# Patient Record
Sex: Female | Born: 1995 | Race: Black or African American | Hispanic: No | Smoking: Current some day smoker
Health system: Southern US, Community
[De-identification: ages and names within clinical notes are randomized; demographics above are authoritative.]

## PROBLEM LIST (undated history)

## (undated) ENCOUNTER — Inpatient Hospital Stay: Payer: Self-pay

## (undated) ENCOUNTER — Emergency Department: Admission: EM | Payer: BC Managed Care – PPO | Source: Home / Self Care

## (undated) ENCOUNTER — Emergency Department: Admission: EM | Discharge status: Against Medical Advice | Payer: BC Managed Care – PPO | Source: Home / Self Care

## (undated) DIAGNOSIS — J302 Other seasonal allergic rhinitis: Secondary | ICD-10-CM

## (undated) DIAGNOSIS — J45909 Unspecified asthma, uncomplicated: Secondary | ICD-10-CM

## (undated) HISTORY — PX: KNEE SURGERY: SHX244

## (undated) HISTORY — PX: NO PAST SURGERIES: SHX2092

## (undated) HISTORY — PX: TUBAL LIGATION: SHX77

---
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
## 2015-08-10 LAB — OB RESULTS CONSOLE HEPATITIS B SURFACE ANTIGEN: Hepatitis B Surface Ag: NEGATIVE

## 2015-08-10 LAB — OB RESULTS CONSOLE HIV ANTIBODY (ROUTINE TESTING): HIV: NONREACTIVE

## 2015-08-10 LAB — OB RESULTS CONSOLE RPR: RPR: NONREACTIVE

## 2015-08-10 LAB — OB RESULTS CONSOLE RUBELLA ANTIBODY, IGM: Rubella: IMMUNE

## 2015-09-05 ENCOUNTER — Ambulatory Visit
Admission: EM | Admit: 2015-09-05 | Discharge: 2015-09-05 | Disposition: A | Discharge status: Home / Self Care | Payer: BLUE CROSS/BLUE SHIELD | Attending: Family Medicine | Admitting: Family Medicine

## 2015-09-05 DIAGNOSIS — J0100 Acute maxillary sinusitis, unspecified: Secondary | ICD-10-CM | POA: Diagnosis not present

## 2015-09-05 MED ORDER — AMOXICILLIN 875 MG PO TABS: 875.0000 mg | ORAL_TABLET | Freq: Two times a day (BID) | ORAL | 0 refills | Status: DC

## 2015-09-05 NOTE — ED Triage Notes (Signed)
Patient complains of sinus pain and pressure x 1 week. Patient states that she has been also having a cough. Patient states that she is [redacted] weeks pregnant.

## 2015-09-05 NOTE — ED Provider Notes (Signed)
[facility]    CSN: [id number] Arrival date & time: 09/05/15  4098  First Provider Contact:  First MD Initiated Contact with Patient 09/05/15 [phone]        History   Chief Complaint Chief Complaint  Patient presents with  . Sinusitis    HPI Sheri Lee is a 20 y.o. female.   The history is provided by the patient.  URI  Presenting symptoms: congestion, ear pain, facial pain and fatigue   Severity:  Moderate Onset quality:  Sudden Duration:  1 week Timing:  Constant Progression:  Worsening Chronicity:  New Relieved by:  None tried Associated symptoms: headaches and sinus pain   Associated symptoms: no swollen glands and no wheezing   Risk factors: sick contacts   Risk factors: not elderly, no chronic cardiac disease, no chronic kidney disease, no chronic respiratory disease, no diabetes mellitus, no immunosuppression, no recent illness and no recent travel   Patient complains of sinus pain and pressure x 1 week. Patient states that she has been also having a cough. Patient states that she is [redacted] weeks pregnant.   Past Medical History:  Diagnosis Date  . Asthma   . Seasonal allergies     There are no active problems to display for this patient.   Past Surgical History:  Procedure Laterality Date  . KNEE SURGERY Left   . NO PAST SURGERIES      OB History    Gravida Para Term Preterm AB Living   1             SAB TAB Ectopic Multiple Live Births                   Home Medications    Prior to Admission medications   Medication Sig Start Date End Date Taking? Authorizing Provider  Prenatal Vit-Fe Fumarate-FA (MULTIVITAMIN-PRENATAL) 27-0.8 MG TABS tablet Take 1 tablet by mouth daily at 12 noon.   Yes Historical Provider, MD  amoxicillin (AMOXIL) 875 MG tablet Take 1 tablet (875 mg total) by mouth 2 (two) times daily. 09/05/15   Payton Mccallum, MD  diazepam (VALIUM) 2 MG tablet Take 1 tablet (2 mg total) by mouth 3 (three) times daily. 10/07/14    Lutricia Feil, PA-C  HYDROcodone-acetaminophen (NORCO) 5-325 MG tablet Take 1 tablet by mouth every 6 (six) hours as needed for moderate pain. 10/06/14   Irean Hong, MD  meloxicam (MOBIC) 15 MG tablet Take 1 tablet (15 mg total) by mouth daily. Do not take Naprosyn or Motrin. 10/14/14   Hassan Rowan, MD  metaxalone (SKELAXIN) 800 MG tablet Take 1 tablet (800 mg total) by mouth 3 (three) times daily. Begin taking only after finishing Valium for muscle spasm 10/07/14   Lutricia Feil, PA-C  metaxalone (SKELAXIN) 800 MG tablet Take 1 tablet (800 mg total) by mouth 3 (three) times daily. Do not take with Valium and may use this with previous Skelaxin prescription 10/14/14   Hassan Rowan, MD    Family History History reviewed. No pertinent family history.  Social History Social History  Substance Use Topics  . Smoking status: Never Smoker  . Smokeless tobacco: Never Used  . Alcohol use No     Allergies   Albuterol   Review of Systems Review of Systems  Constitutional: Positive for fatigue.  HENT: Positive for congestion and ear pain.   Respiratory: Negative for wheezing.   Neurological: Positive for headaches.     Physical Exam Triage Vital  Signs ED Triage Vitals  Enc Vitals Group     BP 09/05/15 0832 108/64     Pulse Rate 09/05/15 0832 85     Resp 09/05/15 0832 18     Temp 09/05/15 0832 98.1 F (36.7 C)     Temp Source 09/05/15 0832 Oral     SpO2 09/05/15 0832 100 %     Weight 09/05/15 0833 120 lb (54.4 kg)     Height 09/05/15 0833 5\' 3"  (1.6 m)     Head Circumference --      Peak Flow --      Pain Score 09/05/15 0835 10     Pain Loc --      Pain Edu? --      Excl. in GC? --    No data found.   Updated Vital Signs BP 108/64 (BP Location: Left Arm)   Pulse 85   Temp 98.1 F (36.7 C) (Oral)   Resp 18   Ht 5\' 3"  (1.6 m)   Wt 120 lb (54.4 kg)   LMP 06/17/2015 (Exact Date)   SpO2 100%   BMI 21.26 kg/m   Visual Acuity Right Eye Distance:   Left Eye  Distance:   Bilateral Distance:    Right Eye Near:   Left Eye Near:    Bilateral Near:     Physical Exam  Constitutional: She appears well-developed and well-nourished. No distress.  HENT:  Head: Normocephalic and atraumatic.  Right Ear: Tympanic membrane, external ear and ear canal normal.  Left Ear: Tympanic membrane, external ear and ear canal normal.  Nose: Mucosal edema and rhinorrhea present. No nose lacerations, sinus tenderness, nasal deformity, septal deviation or nasal septal hematoma. No epistaxis.  No foreign bodies. Right sinus exhibits maxillary sinus tenderness and frontal sinus tenderness. Left sinus exhibits maxillary sinus tenderness and frontal sinus tenderness.  Mouth/Throat: Uvula is midline, oropharynx is clear and moist and mucous membranes are normal. No oropharyngeal exudate.  Eyes: Conjunctivae and EOM are normal. Pupils are equal, round, and reactive to light. Right eye exhibits no discharge. Left eye exhibits no discharge. No scleral icterus.  Neck: Normal range of motion. Neck supple. No thyromegaly present.  Cardiovascular: Normal rate, regular rhythm and normal heart sounds.   Pulmonary/Chest: Effort normal and breath sounds normal. No respiratory distress. She has no wheezes. She has no rales.  Lymphadenopathy:    She has no cervical adenopathy.  Skin: She is not diaphoretic.  Nursing note and vitals reviewed.    UC Treatments / Results  Labs (all labs ordered are listed, but only abnormal results are displayed) Labs Reviewed - No data to display  EKG  EKG Interpretation None       Radiology No results found.  Procedures Procedures (including critical care time)  Medications Ordered in UC Medications - No data to display   Initial Impression / Assessment and Plan / UC Course  I have reviewed the triage vital signs and the nursing notes.  Pertinent labs & imaging results that were available during my care of the patient were reviewed  by me and considered in my medical decision making (see chart for details).  Clinical Course      Final Clinical Impressions(s) / UC Diagnoses   Final diagnoses:  Acute maxillary sinusitis, recurrence not specified    New Prescriptions Discharge Medication List as of 09/05/2015  8:58 AM    START taking these medications   Details  amoxicillin (AMOXIL) 875 MG tablet Take 1 tablet (  875 mg total) by mouth 2 (two) times daily., Starting Mon 09/05/2015, Normal        1. diagnosis reviewed with patient/parent/guardian/family 2. rx as per orders above; reviewed possible side effects, interactions, risks and benefits  3. Recommend supportive treatment with saline rinses    4. Follow-up prn if symptoms worsen or don't improve   Payton Mccallumrlando Mikia Delaluz, MD 09/05/15 [id number]

## 2015-09-26 LAB — OB RESULTS CONSOLE GC/CHLAMYDIA: Gonorrhea: NEGATIVE

## 2015-10-03 ENCOUNTER — Ambulatory Visit
Admission: EM | Admit: 2015-10-03 | Discharge: 2015-10-03 | Disposition: A | Discharge status: Home / Self Care | Payer: BLUE CROSS/BLUE SHIELD | Attending: Family Medicine | Admitting: Family Medicine

## 2015-10-03 DIAGNOSIS — Z202 Contact with and (suspected) exposure to infections with a predominantly sexual mode of transmission: Secondary | ICD-10-CM

## 2015-10-03 LAB — URINALYSIS COMPLETE WITH MICROSCOPIC (ARMC ONLY)
Bacteria, UA: NONE SEEN
Bilirubin Urine: NEGATIVE
Glucose, UA: NEGATIVE mg/dL
Hgb urine dipstick: NEGATIVE
Ketones, ur: NEGATIVE mg/dL
Leukocytes, UA: NEGATIVE
Nitrite: NEGATIVE
Protein, ur: NEGATIVE mg/dL
RBC / HPF: NONE SEEN RBC/hpf (ref 0–5)
Specific Gravity, Urine: 1.025 (ref 1.005–1.030)
pH: 6 (ref 5.0–8.0)

## 2015-10-03 LAB — WET PREP, GENITAL
Clue Cells Wet Prep HPF POC: NONE SEEN
Sperm: NONE SEEN
Trich, Wet Prep: NONE SEEN
Yeast Wet Prep HPF POC: NONE SEEN

## 2015-10-03 MED ORDER — CEFTRIAXONE SODIUM 250 MG IJ SOLR
500.0000 mg | Freq: Once | INTRAMUSCULAR | Status: AC
  Administered 2015-10-03: 500 mg via INTRAMUSCULAR

## 2015-10-03 MED ORDER — AZITHROMYCIN 500 MG PO TABS
1000.0000 mg | ORAL_TABLET | Freq: Once | ORAL | Status: AC
  Administered 2015-10-03: 1000 mg via ORAL

## 2015-10-03 NOTE — ED Provider Notes (Signed)
[facility]    CSN: [id number] Arrival date & time: 10/03/15  1618  First Provider Contact:  First MD Initiated Contact with Patient 10/03/15 1740        History   Chief Complaint Chief Complaint  Patient presents with  . SEXUALLY TRANSMITTED DISEASE    HPI Sheri Lee is a 20 y.o. female.   She is [redacted] weeks pregnant about interest second trimester as of tomorrow. She reports that her partner was tested positive for chlamydia today. She was testing when she had her first OB/GYN visit which was about 10 weeks ago at that time she was negative. Apparently about 2 months ago partner became active somebody else. She also reports in the last 2 weeks he is on perform oral sex but does admit that about 6 weeks so they did have sexual relations involving penile vaginal activity she denies having a discharge. History of asthma seasonal asthma. She's had knee surgery but no other surgeries she is a gravida 1 para 0 AB 0. She never smoked. Is no past family medical history with today's visit.   The history is provided by the patient and a significant other. No language interpreter was used.  Exposure to STD  This is a new problem. The current episode started more than 1 week ago. The problem has been rapidly worsening. Pertinent negatives include no chest pain and no abdominal pain. Nothing aggravates the symptoms. Nothing relieves the symptoms. She has tried nothing for the symptoms. The treatment provided no relief.    Past Medical History:  Diagnosis Date  . Asthma   . Seasonal allergies     There are no active problems to display for this patient.   Past Surgical History:  Procedure Laterality Date  . KNEE SURGERY Left   . NO PAST SURGERIES      OB History    Gravida Para Term Preterm AB Living   1             SAB TAB Ectopic Multiple Live Births                   Home Medications    Prior to Admission medications   Medication Sig Start Date End Date  Taking? Authorizing Provider  Prenatal Vit-Fe Fumarate-FA (MULTIVITAMIN-PRENATAL) 27-0.8 MG TABS tablet Take 1 tablet by mouth daily at 12 noon.   Yes Historical Provider, MD  amoxicillin (AMOXIL) 875 MG tablet Take 1 tablet (875 mg total) by mouth 2 (two) times daily. 09/05/15   Payton Mccallumrlando Conty, MD  diazepam (VALIUM) 2 MG tablet Take 1 tablet (2 mg total) by mouth 3 (three) times daily. 10/07/14   Lutricia FeilWilliam P Roemer, PA-C  HYDROcodone-acetaminophen (NORCO) 5-325 MG tablet Take 1 tablet by mouth every 6 (six) hours as needed for moderate pain. 10/06/14   Irean HongJade J Sung, MD  meloxicam (MOBIC) 15 MG tablet Take 1 tablet (15 mg total) by mouth daily. Do not take Naprosyn or Motrin. 10/14/14   Hassan RowanEugene Melessa Cowell, MD  metaxalone (SKELAXIN) 800 MG tablet Take 1 tablet (800 mg total) by mouth 3 (three) times daily. Begin taking only after finishing Valium for muscle spasm 10/07/14   Lutricia FeilWilliam P Roemer, PA-C  metaxalone (SKELAXIN) 800 MG tablet Take 1 tablet (800 mg total) by mouth 3 (three) times daily. Do not take with Valium and may use this with previous Skelaxin prescription 10/14/14   Hassan RowanEugene Siddiq Kaluzny, MD    Family History History reviewed. No pertinent family history.  Social  History Social History  Substance Use Topics  . Smoking status: Never Smoker  . Smokeless tobacco: Never Used  . Alcohol use No     Allergies   Albuterol   Review of Systems Review of Systems  Cardiovascular: Negative for chest pain.  Gastrointestinal: Negative for abdominal pain.  All other systems reviewed and are negative.    Physical Exam Triage Vital Signs ED Triage Vitals  Enc Vitals Group     BP 10/03/15 1634 115/64     Pulse Rate 10/03/15 1634 89     Resp 10/03/15 1634 18     Temp 10/03/15 1634 98 F (36.7 C)     Temp Source 10/03/15 1634 Oral     SpO2 10/03/15 1634 100 %     Weight 10/03/15 1632 120 lb (54.4 kg)     Height 10/03/15 1632 5\' 3"  (1.6 m)     Head Circumference --      Peak Flow --      Pain Score  10/03/15 1633 0     Pain Loc --      Pain Edu? --      Excl. in GC? --    No data found.   Updated Vital Signs BP 115/64 (BP Location: Left Arm)   Pulse 89   Temp 98 F (36.7 C) (Oral)   Resp 18   Ht 5\' 3"  (1.6 m)   Wt 120 lb (54.4 kg)   LMP 06/17/2015 (Exact Date)   SpO2 100%   BMI 21.26 kg/m   Visual Acuity Right Eye Distance:   Left Eye Distance:   Bilateral Distance:    Right Eye Near:   Left Eye Near:    Bilateral Near:     Physical Exam  Constitutional: She appears well-developed and well-nourished.  HENT:  Head: Normocephalic and atraumatic.  Eyes: Pupils are equal, round, and reactive to light.  Neck: Normal range of motion.  Pulmonary/Chest: Effort normal.  Abdominal: Soft.  Genitourinary: Rectum normal. Rectal exam shows no fissure and no mass. There is no rash on the right labia. There is no rash on the left labia. Uterus is enlarged. Cervix exhibits discharge. Cervix exhibits no motion tenderness and no friability. Right adnexum displays no mass and no tenderness. Left adnexum displays no mass. No erythema or tenderness in the vagina. No foreign body in the vagina. No signs of injury around the vagina. Vaginal discharge found.  Genitourinary Comments: Discharge was present patient is about this state OB age with fundal height about 14-16 weeks.  Musculoskeletal: Normal range of motion.  Neurological: She is alert.  Skin: Skin is warm and dry. Capillary refill takes less than 2 seconds.  Psychiatric: She has a normal mood and affect.  Vitals reviewed.    UC Treatments / Results  Labs (all labs ordered are listed, but only abnormal results are displayed) Labs Reviewed  WET PREP, GENITAL - Abnormal; Notable for the following:       Result Value   WBC, Wet Prep HPF POC MANY (*)    All other components within normal limits  CHLAMYDIA/NGC RT PCR ([facility] ONLY)  URINALYSIS COMPLETEWITH MICROSCOPIC ([facility] ONLY)    EKG  EKG Interpretation None        Radiology No results found.  Procedures Procedures (including critical care time)  Medications Ordered in UC Medications  cefTRIAXone (ROCEPHIN) injection 500 mg (500 mg Intramuscular Given 10/03/15 1809)  azithromycin (ZITHROMAX) tablet 1,000 mg (1,000 mg Oral Given 10/03/15 1808)  Initial Impression / Assessment and Plan / UC Course  I have reviewed the triage vital signs and the nursing notes.  Pertinent labs & imaging results that were available during my care of the patient were reviewed by me and considered in my medical decision making (see chart for details).    Results for orders placed or performed during the hospital encounter of 10/03/15  Wet prep, genital  Result Value Ref Range   Yeast Wet Prep HPF POC NONE SEEN NONE SEEN   Trich, Wet Prep NONE SEEN NONE SEEN   Clue Cells Wet Prep HPF POC NONE SEEN NONE SEEN   WBC, Wet Prep HPF POC MANY (A) NONE SEEN   Sperm NONE SEEN    Clinical Course   Patient will be given  500 mg of Rocephin IM and 1 g of Zithromax by mouth. Since she's almost the second trimester will give her Flagyl to start using tomorrow. Chlamydia and GC tests are pending  Final Clinical Impressions(s) / UC Diagnoses   Final diagnoses:  Exposure to sexually transmitted disease (STD)    New Prescriptions New Prescriptions   No medications on file     Hassan Rowan, MD 10/03/15 1610

## 2015-10-03 NOTE — ED Triage Notes (Signed)
Patient presents with a request for antibiotic to treat Chlamydia. She states she had oral sex with her partner and they were tested today and tested positive.

## 2015-10-04 LAB — CHLAMYDIA/NGC RT PCR (ARMC ONLY)
Chlamydia Tr: NOT DETECTED
N gonorrhoeae: NOT DETECTED

## 2015-12-02 ENCOUNTER — Observation Stay
Admission: EM | Admit: 2015-12-02 | Discharge: 2015-12-02 | Disposition: A | Discharge status: Home / Self Care | Payer: BLUE CROSS/BLUE SHIELD | Source: Intra-hospital | Attending: Obstetrics & Gynecology | Admitting: Obstetrics & Gynecology

## 2015-12-02 ENCOUNTER — Encounter: Payer: Self-pay | Admitting: *Deleted

## 2015-12-02 DIAGNOSIS — K5900 Constipation, unspecified: Secondary | ICD-10-CM | POA: Diagnosis not present

## 2015-12-02 DIAGNOSIS — O2692 Pregnancy related conditions, unspecified, second trimester: Secondary | ICD-10-CM | POA: Diagnosis present

## 2015-12-02 DIAGNOSIS — Z888 Allergy status to other drugs, medicaments and biological substances status: Secondary | ICD-10-CM | POA: Diagnosis not present

## 2015-12-02 DIAGNOSIS — R1013 Epigastric pain: Secondary | ICD-10-CM | POA: Insufficient documentation

## 2015-12-02 DIAGNOSIS — Z3A24 24 weeks gestation of pregnancy: Secondary | ICD-10-CM | POA: Diagnosis not present

## 2015-12-02 DIAGNOSIS — R109 Unspecified abdominal pain: Secondary | ICD-10-CM | POA: Diagnosis present

## 2015-12-02 DIAGNOSIS — J45909 Unspecified asthma, uncomplicated: Secondary | ICD-10-CM | POA: Diagnosis not present

## 2015-12-02 DIAGNOSIS — Z79899 Other long term (current) drug therapy: Secondary | ICD-10-CM | POA: Diagnosis not present

## 2015-12-02 LAB — FETAL FIBRONECTIN: FETAL FIBRONECTIN: NEGATIVE

## 2015-12-02 LAB — URINALYSIS COMPLETE WITH MICROSCOPIC (ARMC ONLY)
BILIRUBIN URINE: NEGATIVE
Bacteria, UA: NONE SEEN
GLUCOSE, UA: NEGATIVE mg/dL
HGB URINE DIPSTICK: NEGATIVE
Ketones, ur: NEGATIVE mg/dL
NITRITE: NEGATIVE
Protein, ur: NEGATIVE mg/dL
RBC / HPF: NONE SEEN RBC/hpf (ref 0–5)
SPECIFIC GRAVITY, URINE: 1.012 (ref 1.005–1.030)
pH: 8 (ref 5.0–8.0)

## 2015-12-02 LAB — CHLAMYDIA/NGC RT PCR (ARMC ONLY)
CHLAMYDIA TR: NOT DETECTED
N gonorrhoeae: NOT DETECTED

## 2015-12-02 LAB — WET PREP, GENITAL
Clue Cells Wet Prep HPF POC: NONE SEEN
Sperm: NONE SEEN
Trich, Wet Prep: NONE SEEN
Yeast Wet Prep HPF POC: NONE SEEN

## 2015-12-02 NOTE — OB Triage Note (Signed)
Pt states she has her next appointment with Dr Feliberto GottronSchermerhorn on Dec 7th/17. Instructed to call sooner if further concerns or questions arise- pt voiced agreement

## 2015-12-02 NOTE — Discharge Summary (Signed)
Sheri Lee is a 20 y.o. female. She is at 241w0d gestation.  Chief Complaint: upper abdominal pain   S: Resting comfortably. no CTX, no VB.no LOF,  Active fetal movement.   Location: epigastrium Context: patient started having intense upper abdominal pain at epigastrium and radiating to left side and downward this morning and it got worse.  Has improved some since arrival to triage. Onset/timing/duration: 09:30 AM today Quality: cramping, intermittent Severity: moderate to severe Aggravating factors: mothing Alleviating factors:nothing Associated signs/symptoms: denies nausea, vomiting, diarrhea.  Has been constipated.  No fever/chills.  Not related to food intake.  Did not overdo it yestrday @ thanksgiving dinner   Maternal Medical History:   Past Medical History:  Diagnosis Date  . Asthma   . Seasonal allergies     Past Surgical History:  Procedure Laterality Date  . KNEE SURGERY Left   . NO PAST SURGERIES      Allergies  Allergen Reactions  . Albuterol Swelling    Prior to Admission medications   Medication Sig Start Date End Date Taking? Authorizing Provider  Prenatal Vit-Fe Fumarate-FA (MULTIVITAMIN-PRENATAL) 27-0.8 MG TABS tablet Take 1 tablet by mouth daily at 12 noon.   Yes Historical Provider, MD  amoxicillin (AMOXIL) 875 MG tablet Take 1 tablet (875 mg total) by mouth 2 (two) times daily. Patient not taking: Reported on 12/02/2015 09/05/15   Payton Mccallumrlando Conty, MD  diazepam (VALIUM) 2 MG tablet Take 1 tablet (2 mg total) by mouth 3 (three) times daily. Patient not taking: Reported on 12/02/2015 10/07/14   Lutricia FeilWilliam P Roemer, PA-C  HYDROcodone-acetaminophen (NORCO) 5-325 MG tablet Take 1 tablet by mouth every 6 (six) hours as needed for moderate pain. Patient not taking: Reported on 12/02/2015 10/06/14   Irean HongJade J Sung, MD  meloxicam (MOBIC) 15 MG tablet Take 1 tablet (15 mg total) by mouth daily. Do not take Naprosyn or Motrin. Patient not taking: Reported on  12/02/2015 10/14/14   Hassan RowanEugene Wade, MD  metaxalone (SKELAXIN) 800 MG tablet Take 1 tablet (800 mg total) by mouth 3 (three) times daily. Begin taking only after finishing Valium for muscle spasm 10/07/14   Lutricia FeilWilliam P Roemer, PA-C  metaxalone (SKELAXIN) 800 MG tablet Take 1 tablet (800 mg total) by mouth 3 (three) times daily. Do not take with Valium and may use this with previous Skelaxin prescription Patient not taking: Reported on 12/02/2015 10/14/14   Hassan RowanEugene Wade, MD     [facility]   Social History: She  reports that she has never smoked. She has never used smokeless tobacco. She reports that she does not drink alcohol or use drugs.  Family History: family history is not on file.   Review of Systems: A full review of systems was performed and negative except as noted in the HPI.     O:  BP 115/68 (BP Location: Left Arm)   Pulse 84   Temp 97.9 F (36.6 C) (Oral)   Resp 16   Ht 5\' 3"  (1.6 m)   Wt 65.8 kg (145 lb)   LMP 06/17/2015 (Exact Date)   BMI 25.69 kg/m  Results for orders placed or performed during the hospital encounter of 12/02/15 (from the past 48 hour(s))  Chlamydia/NGC rt PCR ([facility] only)   Collection Time: 12/02/15  2:20 PM  Result Value Ref Range   Specimen source GC/Chlam ENDOCERVICAL    Chlamydia Tr NOT DETECTED NOT DETECTED   N gonorrhoeae NOT DETECTED NOT DETECTED  Wet prep, genital  Collection Time: 12/02/15  2:20 PM  Result Value Ref Range   Yeast Wet Prep HPF POC NONE SEEN NONE SEEN   Trich, Wet Prep NONE SEEN NONE SEEN   Clue Cells Wet Prep HPF POC NONE SEEN NONE SEEN   WBC, Wet Prep HPF POC RARE (A) NONE SEEN   Sperm NONE SEEN   Fetal fibronectin   Collection Time: 12/02/15  2:20 PM  Result Value Ref Range   Fetal Fibronectin NEGATIVE NEGATIVE   Appearance, FETFIB CLEAR CLEAR  Urinalysis complete, with microscopic ([facility] only)   Collection Time: 12/02/15  2:29 PM  Result Value Ref Range   Color, Urine STRAW (A) YELLOW    APPearance CLEAR (A) CLEAR   Glucose, UA NEGATIVE NEGATIVE mg/dL   Bilirubin Urine NEGATIVE NEGATIVE   Ketones, ur NEGATIVE NEGATIVE mg/dL   Specific Gravity, Urine 1.012 1.005 - 1.030   Hgb urine dipstick NEGATIVE NEGATIVE   pH 8.0 5.0 - 8.0   Protein, ur NEGATIVE NEGATIVE mg/dL   Nitrite NEGATIVE NEGATIVE   Leukocytes, UA 1+ (A) NEGATIVE   RBC / HPF NONE SEEN 0 - 5 RBC/hpf   WBC, UA 0-5 0 - 5 WBC/hpf   Bacteria, UA NONE SEEN NONE SEEN   Squamous Epithelial / LPF 0-5 (A) NONE SEEN   Mucous PRESENT      Constitutional: NAD, AAOx3  HE/ENT: extraocular movements grossly intact, moist mucous membranes CV: RRR PULM: nl respiratory effort, CTABL     Abd: gravid, non-tender, non-distended, soft      Ext: Non-tender, Nonedmeatous   Psych: mood appropriate, speech normal Pelvic:  Sterile speculum exam: no dilation SVE: c/l/h  FHT: 145 TOCO: irritable    A/P: 20yo G1P0 @ 24.0 weeks with GI upset   Labor: not present.   Fetal Wellbeing:  Cat 1 tracing.  Abdominal pain likely GI in origin, labs negative, non-reproducible pain on exam.  Patient comfortable in entirety of stay in triage.  For constipation, prunes, stool softener, plenty of hydration  FFN negative  D/c home stable, precautions reviewed, follow-up as scheduled.   ----- Ranae Plumberhelsea Avery Klingbeil, MD Attending Obstetrician and Gynecologist [facility], Department of OB/GYN [facility]

## 2015-12-02 NOTE — Discharge Instructions (Signed)
For constipation attention:  Increase fluids,  Increase raw fruit and vegetable input, Try whole grain food choices versus white grain products Try prune juice or prunes if desired Try fruit cups, and make sure to drink the juice Oregano steeped tea, may add honey or sugar to improve taste after removing the oregano Gas -Ex over the counter medication

## 2015-12-02 NOTE — OB Triage Note (Signed)
Pt discussed D/C instructions. Also stated she no linger takes the following medications: amoxicillin- finished course for sinus infection, Diazepam, Norco, Meloxicam,

## 2015-12-02 NOTE — OB Triage Note (Signed)
Pt here with compliants of abdominal pain that at 0930 this a.m., pain getting worse - pt. Decided to come to hospital to be evaluated.  Denies sudden gush of fluid,  Or vaginal bleeding. Positive for fetal movement. Last sexual encounter was one week ago.  Pt connected to external fetal monitor - FHR 140s; toco applied, abd. Soft. Clean catch urine collected for additional test if needed.

## 2016-01-09 HISTORY — PX: OTHER SURGICAL HISTORY: SHX169

## 2016-02-01 ENCOUNTER — Observation Stay
Admission: EM | Admit: 2016-02-01 | Discharge: 2016-02-02 | Disposition: A | Discharge status: Home / Self Care | Payer: BLUE CROSS/BLUE SHIELD | Attending: Obstetrics and Gynecology | Admitting: Obstetrics and Gynecology

## 2016-02-01 DIAGNOSIS — E860 Dehydration: Secondary | ICD-10-CM | POA: Insufficient documentation

## 2016-02-01 DIAGNOSIS — O99513 Diseases of the respiratory system complicating pregnancy, third trimester: Secondary | ICD-10-CM | POA: Diagnosis not present

## 2016-02-01 DIAGNOSIS — J45909 Unspecified asthma, uncomplicated: Secondary | ICD-10-CM | POA: Insufficient documentation

## 2016-02-01 DIAGNOSIS — Z3A32 32 weeks gestation of pregnancy: Secondary | ICD-10-CM | POA: Insufficient documentation

## 2016-02-01 DIAGNOSIS — R112 Nausea with vomiting, unspecified: Secondary | ICD-10-CM | POA: Diagnosis not present

## 2016-02-01 DIAGNOSIS — D696 Thrombocytopenia, unspecified: Secondary | ICD-10-CM | POA: Insufficient documentation

## 2016-02-01 DIAGNOSIS — O26893 Other specified pregnancy related conditions, third trimester: Principal | ICD-10-CM | POA: Insufficient documentation

## 2016-02-01 DIAGNOSIS — O98519 Other viral diseases complicating pregnancy, unspecified trimester: Secondary | ICD-10-CM | POA: Diagnosis present

## 2016-02-01 DIAGNOSIS — I10 Essential (primary) hypertension: Secondary | ICD-10-CM | POA: Insufficient documentation

## 2016-02-01 LAB — COMPREHENSIVE METABOLIC PANEL
ALBUMIN: 3.2 g/dL — AB (ref 3.5–5.0)
ALK PHOS: 105 U/L (ref 38–126)
ALT: 23 U/L (ref 14–54)
ANION GAP: 9 (ref 5–15)
AST: 31 U/L (ref 15–41)
BUN: 8 mg/dL (ref 6–20)
CO2: 23 mmol/L (ref 22–32)
Calcium: 9 mg/dL (ref 8.9–10.3)
Chloride: 105 mmol/L (ref 101–111)
Creatinine, Ser: 0.79 mg/dL (ref 0.44–1.00)
GFR calc Af Amer: >60 mL/min (ref >60)
GFR calc non Af Amer: >60 mL/min (ref >60)
GLUCOSE: 79 mg/dL (ref 65–99)
POTASSIUM: 3.6 mmol/L (ref 3.5–5.1)
SODIUM: 137 mmol/L (ref 135–145)
Total Bilirubin: 0.6 mg/dL (ref 0.3–1.2)
Total Protein: 7 g/dL (ref 6.5–8.1)

## 2016-02-01 LAB — URINALYSIS, ROUTINE W REFLEX MICROSCOPIC
BACTERIA UA: NONE SEEN
BILIRUBIN URINE: NEGATIVE
Glucose, UA: >=500 mg/dL — AB
Hgb urine dipstick: NEGATIVE
KETONES UR: 5 mg/dL — AB
Leukocytes, UA: NEGATIVE
NITRITE: NEGATIVE
Protein, ur: NEGATIVE mg/dL
RBC / HPF: NONE SEEN RBC/hpf (ref 0–5)
SPECIFIC GRAVITY, URINE: 1.007 (ref 1.005–1.030)
pH: 7 (ref 5.0–8.0)

## 2016-02-01 LAB — CBC
HEMATOCRIT: 37 % (ref 35.0–47.0)
HEMOGLOBIN: 11.8 g/dL — AB (ref 12.0–16.0)
MCH: 27.2 pg (ref 26.0–34.0)
MCHC: 31.8 g/dL — AB (ref 32.0–36.0)
MCV: 85.5 fL (ref 80.0–100.0)
Platelets: 117 10*3/uL — ABNORMAL LOW (ref 150–440)
RBC: 4.33 MIL/uL (ref 3.80–5.20)
RDW: 12.9 % (ref 11.5–14.5)
WBC: 13.2 10*3/uL — ABNORMAL HIGH (ref 3.6–11.0)

## 2016-02-01 LAB — INFLUENZA PANEL BY PCR (TYPE A & B)
Influenza A By PCR: NEGATIVE
Influenza B By PCR: NEGATIVE

## 2016-02-01 MED ORDER — SODIUM CHLORIDE FLUSH 0.9 % IV SOLN
INTRAVENOUS | Status: AC
  Filled 2016-02-01: qty 10

## 2016-02-01 MED ORDER — ONDANSETRON HCL 4 MG/2ML IJ SOLN
4.0000 mg | Freq: Four times a day (QID) | INTRAMUSCULAR | Status: DC | PRN
  Administered 2016-02-01: 4 mg via INTRAVENOUS
  Filled 2016-02-01: qty 2

## 2016-02-01 MED ORDER — LOPERAMIDE HCL 2 MG PO CAPS
ORAL_CAPSULE | ORAL | Status: AC
  Administered 2016-02-01: 2 mg via ORAL
  Filled 2016-02-01: qty 1

## 2016-02-01 MED ORDER — DEXTROSE IN LACTATED RINGERS 5 % IV SOLN
INTRAVENOUS | Status: DC
  Administered 2016-02-01 – 2016-02-02 (×2): via INTRAVENOUS

## 2016-02-01 MED ORDER — LOPERAMIDE HCL 2 MG PO CAPS: 2.0000 mg | ORAL_CAPSULE | Freq: Three times a day (TID) | ORAL | Status: DC | PRN

## 2016-02-01 MED ORDER — DEXTROSE 5 % IN LACTATED RINGERS IV BOLUS
1000.0000 mL | Freq: Once | INTRAVENOUS | Status: AC
  Administered 2016-02-01: 1000 mL via INTRAVENOUS

## 2016-02-01 MED ORDER — PROMETHAZINE HCL 25 MG/ML IJ SOLN
25.0000 mg | Freq: Four times a day (QID) | INTRAMUSCULAR | Status: DC | PRN
  Administered 2016-02-01: 25 mg via INTRAVENOUS

## 2016-02-01 MED ORDER — PROMETHAZINE HCL 25 MG/ML IJ SOLN
INTRAMUSCULAR | Status: AC
Start: 2016-02-01 — End: 2016-02-01
  Administered 2016-02-01: 25 mg via INTRAVENOUS
  Filled 2016-02-01: qty 1

## 2016-02-01 MED ORDER — LOPERAMIDE HCL 2 MG PO CAPS
2.0000 mg | ORAL_CAPSULE | ORAL | Status: DC | PRN
  Administered 2016-02-01 – 2016-02-02 (×2): 2 mg via ORAL
  Filled 2016-02-01: qty 1

## 2016-02-01 NOTE — Progress Notes (Signed)
Sheri BenderShaneisha Dannielle Lee [birth year] G1 P0 [id number] presents for 1 day h/o N/v  Diarrhea. Pt is unable to keep anything down . Several bouts of both emesis and diarrhea. Nl Fetal movt  No LOF , no  vaginal bleeding ,She is feeling ctx .She has been around a lot of sick people at home . She has received influenza vaccine in pregnancy  PobHX : unremarkable    PMHX : asthma PSHX left knee surgery  ROS : unremarkable  Social : no etoh , no tobacco  Allergies albuterol O;BP 112/75 (BP Location: Left Arm)   Pulse (!) 109   Temp 98.2 F (36.8 C) (Oral)   Resp 16   Ht 5\' 3"  (1.6 m)   Wt 150 lb (68 kg)   LMP 06/17/2015 (Exact Date)   BMI 26.57 kg/m  ABDsoft  NT  CX closed / 50 % / blt / vtx NST fetal tachycardia 160-180, + accels . Moderate variability . Regular ctx  Labs: e lytes wnl , plt 117K A: Viral GE , r/o influenza. Dehydration , mild thrombocytopenia  P:IVF bolus complete , cont D5LR at 150 cc / hr  phenergan 25 mg q 6 hrs prn N/V , zofran 4 mg IV q 6 hr prn  Tylenol 650 q 6 hr prn fever/ pain  Imodium 2 mg tid prn diarrhea  Influenza screen  Small po food prn  Continuous monitoring   Patient ID: Sheri Lee, female   DOB: [birth year], 20 y.o.   MRN: [id number]

## 2016-02-01 NOTE — OB Triage Note (Signed)
Pt presents c/o constant pain in the lower abdomen rating a 5/10. that started this morning. Pt says that she has vomited about 6 times today and had two episodes of diarrhea. She states that she has been feeling lightheaded as well. Pt says she has tried to drink and eat today but nothing will stay down. Pt denies any bleeding or LOF. Pt reports positive fetal movement.

## 2016-02-02 DIAGNOSIS — O26893 Other specified pregnancy related conditions, third trimester: Secondary | ICD-10-CM | POA: Diagnosis not present

## 2016-02-02 LAB — COMPREHENSIVE METABOLIC PANEL
ALBUMIN: 2.4 g/dL — AB (ref 3.5–5.0)
ALT: 27 U/L (ref 14–54)
AST: 39 U/L (ref 15–41)
Alkaline Phosphatase: 78 U/L (ref 38–126)
Anion gap: 6 (ref 5–15)
BUN: 7 mg/dL (ref 6–20)
CALCIUM: 8 mg/dL — AB (ref 8.9–10.3)
CO2: 22 mmol/L (ref 22–32)
CREATININE: 0.59 mg/dL (ref 0.44–1.00)
Chloride: 108 mmol/L (ref 101–111)
GFR calc Af Amer: >60 mL/min (ref >60)
GFR calc non Af Amer: >60 mL/min (ref >60)
GLUCOSE: 88 mg/dL (ref 65–99)
Potassium: 3.1 mmol/L — ABNORMAL LOW (ref 3.5–5.1)
Sodium: 136 mmol/L (ref 135–145)
Total Bilirubin: 0.6 mg/dL (ref 0.3–1.2)
Total Protein: 5.2 g/dL — ABNORMAL LOW (ref 6.5–8.1)

## 2016-02-02 LAB — CBC WITH DIFFERENTIAL/PLATELET
Basophils Absolute: 0.1 10*3/uL (ref 0–0.1)
Basophils Relative: 1 %
Eosinophils Absolute: 0.5 10*3/uL (ref 0–0.7)
Eosinophils Relative: 6 %
HCT: 29.5 % — ABNORMAL LOW (ref 35.0–47.0)
Hemoglobin: 10 g/dL — ABNORMAL LOW (ref 12.0–16.0)
Lymphocytes Relative: 9 %
Lymphs Abs: 0.7 10*3/uL — ABNORMAL LOW (ref 1.0–3.6)
MCH: 28.4 pg (ref 26.0–34.0)
MCHC: 33.9 g/dL (ref 32.0–36.0)
MCV: 83.7 fL (ref 80.0–100.0)
Monocytes Absolute: 0.2 10*3/uL (ref 0.2–0.9)
Monocytes Relative: 2 %
Neutro Abs: 6.5 10*3/uL (ref 1.4–6.5)
Neutrophils Relative %: 82 %
Platelets: 96 10*3/uL — ABNORMAL LOW (ref 150–440)
RBC: 3.52 MIL/uL — ABNORMAL LOW (ref 3.80–5.20)
RDW: 12.6 % (ref 11.5–14.5)
WBC: 7.9 10*3/uL (ref 3.6–11.0)

## 2016-02-02 NOTE — Discharge Instructions (Signed)
Bland Diet Introduction A bland diet consists of foods that do not have a lot of fat or fiber. Foods without fat or fiber are easier for the body to digest. They are also less likely to irritate your mouth, throat, stomach, and other parts of your gastrointestinal tract. A bland diet is sometimes called a BRAT diet. What is my plan? Your health care provider or dietitian may recommend specific changes to your diet to prevent and treat your symptoms, such as:  Eating small meals often.  Cooking food until it is soft enough to chew easily.  Chewing your food well.  Drinking fluids slowly.  Not eating foods that are very spicy, sour, or fatty.  Not eating citrus fruits, such as oranges and grapefruit. What do I need to know about this diet?  Eat a variety of foods from the bland diet food list.  Do not follow a bland diet longer than you have to.  Ask your health care provider whether you should take vitamins. What foods can I eat? Grains  Hot cereals, such as cream of wheat. Bread, crackers, or tortillas made from refined white flour. Rice. Vegetables  Canned or cooked vegetables. Mashed or boiled potatoes. Fruits  Bananas. Applesauce. Other types of cooked or canned fruit with the skin and seeds removed, such as canned peaches or pears. Meats and Other Protein Sources  Scrambled eggs. Creamy peanut butter or other nut butters. Lean, well-cooked meats, such as chicken or fish. Tofu. Soups or broths. Dairy  Low-fat dairy products, such as milk, cottage cheese, or yogurt. Beverages  Water. Herbal tea. Apple juice. Sweets and Desserts  Pudding. Custard. Fruit gelatin. Ice cream. Fats and Oils  Mild salad dressings. Canola or olive oil. The items listed above may not be a complete list of allowed foods or beverages. Contact your dietitian for more options.  What foods are not recommended? Foods and ingredients that are often not recommended include:  Spicy foods, such as hot  sauce or salsa.  Fried foods.  Sour foods, such as pickled or fermented foods.  Raw vegetables or fruits, especially citrus or berries.  Caffeinated drinks.  Alcohol.  Strongly flavored seasonings or condiments. The items listed above may not be a complete list of foods and beverages that are not allowed. Contact your dietitian for more information.  This information is not intended to replace advice given to you by your health care provider. Make sure you discuss any questions you have with your health care provider. Document Released: 04/18/2015 Document Revised: 06/02/2015 Document Reviewed: 01/06/2014  2017 Elsevier  

## 2016-02-02 NOTE — Progress Notes (Addendum)
Patient ID: Sheri Lee, female   DOB: September 16, 1995, 21 y.o.   MRN: [id number]  S: "I am feeling much better now", 4 total episodes of diarrhea, no stools since Immodium during the night. Emesis is now controlled. Pt states, "Wants to go home". No LOF, VB, decreased FM or UC's. [facility]. New Thrombocytopenia noted. Past Medical History:  Diagnosis Date  . Asthma   . Hypertension   . Seasonal allergies    Past Surgical History:  Procedure Laterality Date  . KNEE SURGERY Left   . NO PAST SURGERIES    History reviewed. No pertinent family history.  Social History   Social History  . Marital status: Single    Spouse name: N/A  . Number of children: N/A  . Years of education: N/A   Occupational History  . Not on file.   Social History Main Topics  . Smoking status: Never Smoker  . Smokeless tobacco: Never Used  . Alcohol use No  . Drug use: No  . Sexual activity: Yes    Birth control/ protection: None   Other Topics Concern  . Not on file   Social History Narrative  . No narrative on file  Gen: 21 yo Black female in NAD. HEENT: Normcephalic, eyes non-icteric. Heart: s1S2, RRR, No M/R/G. Lungs: CTA bilat, no W/R/R. Abd: Gravid Vitals:   02/02/16 0634 02/02/16 0750  BP:  (!) 96/50  Pulse:  94  Resp: 20 16  Temp: 99.1 F (37.3 C) 98.9 F (37.2 C)  After IV hydration and crackers and water, pt is feeling improved and wants to go home. A; IUP at 33 weeks. 2. Viral Illness (poss Noro virus) which is currently in the area 3. Thrombocytopenia 96,000 this am 4. NST reactive with 2 accels 15 x 15 BPM 5. Electrolyes WNL P: DC home with call in RX per Dr Felicita GageJS for Zofran 2. FU as scheduled. 3. Call or come in for any further concerns.

## 2016-02-18 ENCOUNTER — Inpatient Hospital Stay
Admission: EM | Admit: 2016-02-18 | Discharge: 2016-02-19 | Disposition: A | Discharge status: Home / Self Care | Payer: BLUE CROSS/BLUE SHIELD | Attending: Obstetrics and Gynecology | Admitting: Obstetrics and Gynecology

## 2016-02-18 DIAGNOSIS — J45909 Unspecified asthma, uncomplicated: Secondary | ICD-10-CM | POA: Insufficient documentation

## 2016-02-18 DIAGNOSIS — O4703 False labor before 37 completed weeks of gestation, third trimester: Secondary | ICD-10-CM

## 2016-02-18 DIAGNOSIS — O99513 Diseases of the respiratory system complicating pregnancy, third trimester: Secondary | ICD-10-CM | POA: Insufficient documentation

## 2016-02-18 DIAGNOSIS — O10913 Unspecified pre-existing hypertension complicating pregnancy, third trimester: Secondary | ICD-10-CM | POA: Diagnosis not present

## 2016-02-18 DIAGNOSIS — O99113 Other diseases of the blood and blood-forming organs and certain disorders involving the immune mechanism complicating pregnancy, third trimester: Secondary | ICD-10-CM | POA: Insufficient documentation

## 2016-02-18 DIAGNOSIS — D696 Thrombocytopenia, unspecified: Secondary | ICD-10-CM | POA: Diagnosis not present

## 2016-02-18 DIAGNOSIS — Z3A35 35 weeks gestation of pregnancy: Secondary | ICD-10-CM | POA: Diagnosis present

## 2016-02-18 LAB — URINALYSIS, COMPLETE (UACMP) WITH MICROSCOPIC
Bilirubin Urine: NEGATIVE
Glucose, UA: NEGATIVE mg/dL
Hgb urine dipstick: NEGATIVE
Ketones, ur: 5 mg/dL — AB
Nitrite: NEGATIVE
PH: 7 (ref 5.0–8.0)
Protein, ur: NEGATIVE mg/dL
SPECIFIC GRAVITY, URINE: 1.008 (ref 1.005–1.030)

## 2016-02-18 NOTE — OB Triage Note (Signed)
Patient came in complaining of contractions every 3 minutes x1 hr, 6/10 pain. Patient endorses fetal movement, denies LOF. Patient states she had episode of vaginal bleeding x1 week ago but none since. Patient admits to having intercourse within the last 24 hours. Monitors applied, RN assessing at bedside.

## 2016-02-19 DIAGNOSIS — O4703 False labor before 37 completed weeks of gestation, third trimester: Secondary | ICD-10-CM

## 2016-02-19 LAB — CHLAMYDIA/NGC RT PCR (ARMC ONLY)
Chlamydia Tr: NOT DETECTED
N gonorrhoeae: NOT DETECTED

## 2016-02-19 NOTE — OB Triage Provider Note (Signed)
History     CSN: [id number]  Arrival date and time: 02/18/16 2239   None     Chief Complaint  Patient presents with  . Contractions   HPI Sheri Lee is a 21 yo G1P0 at 35+1 weeks by LMP of 06/17/15, presenting today with threatened preterm labor contractions.  She states the contractions started about 10pm and were coming every 3 minutes, rating them at 6/10.  She reports recent intercourse today.  She denies vaginal bleeding, LOF, abnormal vaginal discharge, and dysuria.  She states she has not been timing the ctxs since she has been here, but declines the need for pain medication now.  She endorses good fetal movement and states she has been hydrating well today.   OB History    Gravida Para Term Preterm AB Living   1             SAB TAB Ectopic Multiple Live Births                  Past Medical History:  Diagnosis Date  . Asthma   . Hypertension   . Seasonal allergies     Past Surgical History:  Procedure Laterality Date  . KNEE SURGERY Left   . NO PAST SURGERIES      History reviewed. No pertinent family history.  Social History  Substance Use Topics  . Smoking status: Never Smoker  . Smokeless tobacco: Never Used  . Alcohol use No    Allergies:  Allergies  Allergen Reactions  . Albuterol Swelling    swelling    Prescriptions Prior to Admission  Medication Sig Dispense Refill Last Dose  . Prenatal Vit-Fe Fumarate-FA (MULTIVITAMIN-PRENATAL) 27-0.8 MG TABS tablet Take 1 tablet by mouth daily at 12 noon.   02/18/2016 at Unknown time  . amoxicillin (AMOXIL) 875 MG tablet Take 1 tablet (875 mg total) by mouth 2 (two) times daily. (Patient not taking: Reported on 12/02/2015) 20 tablet 0 Not Taking at Unknown time  . diazepam (VALIUM) 2 MG tablet Take 1 tablet (2 mg total) by mouth 3 (three) times daily. (Patient not taking: Reported on 12/02/2015) 6 tablet 0 Not Taking at Unknown time  . HYDROcodone-acetaminophen (NORCO) 5-325 MG tablet Take 1 tablet by  mouth every 6 (six) hours as needed for moderate pain. (Patient not taking: Reported on 12/02/2015) 15 tablet 0 Not Taking at Unknown time  . meloxicam (MOBIC) 15 MG tablet Take 1 tablet (15 mg total) by mouth daily. Do not take Naprosyn or Motrin. (Patient not taking: Reported on 12/02/2015) 30 tablet 0 Not Taking at Unknown time  . metaxalone (SKELAXIN) 800 MG tablet Take 1 tablet (800 mg total) by mouth 3 (three) times daily. Begin taking only after finishing Valium for muscle spasm (Patient not taking: Reported on 02/01/2016) 21 tablet 0 Not Taking at Unknown time  . metaxalone (SKELAXIN) 800 MG tablet Take 1 tablet (800 mg total) by mouth 3 (three) times daily. Do not take with Valium and may use this with previous Skelaxin prescription (Patient not taking: Reported on 12/02/2015) 60 tablet 1 Not Taking at Unknown time    Review of Systems  Constitutional: Negative.   HENT: Negative.   Cardiovascular: Negative.   Gastrointestinal: Negative.   Endocrine: Negative.   Genitourinary:       Uterine contractions every 3 minutes  Musculoskeletal: Negative.   Neurological: Negative.   Psychiatric/Behavioral: Negative.    Physical Exam   Blood pressure 109/89, pulse 91, temperature 97.7 F (36.5  Lee), temperature source Oral, resp. rate 18, last menstrual period 06/17/2015.  Physical Exam  Constitutional: She is oriented to person, place, and time. She appears well-developed and well-nourished.  Genitourinary:  Genitourinary Comments: Uterus: gravid, non-tender SVE: 1.5cm/50%/-3/vtx minimal bloody show present on glove   Neurological: She is alert and oriented to person, place, and time.  Skin: Skin is warm and dry.   Fetal monitoring: Baseline: 135 bpm/ moderate variability/ +accels/ no decelerations Toco: every 2-8 minutes with some UI/ palpating mild  Results for orders placed or performed during the hospital encounter of 02/18/16 (from the past 24 hour(s))  Urinalysis, Complete w  Microscopic     Status: Abnormal   Collection Time: 02/18/16 11:22 PM  Result Value Ref Range   Color, Urine YELLOW (A) YELLOW   APPearance CLEAR (A) CLEAR   Specific Gravity, Urine 1.008 1.005 - 1.030   pH 7.0 5.0 - 8.0   Glucose, UA NEGATIVE NEGATIVE mg/dL   Hgb urine dipstick NEGATIVE NEGATIVE   Bilirubin Urine NEGATIVE NEGATIVE   Ketones, ur 5 (A) NEGATIVE mg/dL   Protein, ur NEGATIVE NEGATIVE mg/dL   Nitrite NEGATIVE NEGATIVE   Leukocytes, UA MODERATE (A) NEGATIVE   RBC / HPF 0-5 0 - 5 RBC/hpf   WBC, UA 6-30 0 - 5 WBC/hpf   Bacteria, UA RARE (A) NONE SEEN   Squamous Epithelial / LPF 6-30 (A) NONE SEEN   Mucous PRESENT   Chlamydia/NGC rt PCR ([facility] only)     Status: None   Collection Time: 02/18/16 11:22 PM  Result Value Ref Range   Specimen source GC/Chlam VAGINA    Chlamydia Tr NOT DETECTED NOT DETECTED   N gonorrhoeae NOT DETECTED NOT DETECTED     Procedures   Assessment and Plan  1. IUP at 35+1 with threatened preterm labor    - Oral hydration    - UA - moderate leukocytes but no evidence of UTI     - GC/CH - negative,     - Continuous fetal monitoring  2. Category 1 Fetal tracing  3. Gestational Thrombocytopenia on 02/02/16 - platelets were 96      - F/u outpatient   Dr. Feliberto GottronSchermerhorn updated and aware of plan  Sheri Lee, Sheri Lee 02/19/2016, 12:10 AM   Reassessment at 2:30am: Pt. Denies UCs for 1.5 hours and no further cervical change and denies current pain   1. D/Lee home with preterm labor precautions    - F/U at appt on Monday     - Pelvic rest for now    - FKC's daily 2. Category 1 Fetal tracing  3. Gestational Thrombocytopenia on 02/02/16 - platelets were 96      - F/u outpatient   Carlean JewsMeredith Jeanee Fabre, CNM

## 2016-02-19 NOTE — Final Progress Note (Signed)
CSN: [id number]  Arrival date and time: 02/18/16 2239   None        Chief Complaint  Patient presents with  . Contractions   HPI Sheri Lee is a 21 yo G1P0 at 35+1 weeks by LMP of 06/17/15, presenting today with threatened preterm labor contractions.  She states the contractions started about 10pm and were coming every 3 minutes, rating them at 6/10.  She reports recent intercourse today.  She denies vaginal bleeding, LOF, abnormal vaginal discharge, and dysuria.  She states she has not been timing the ctxs since she has been here, but declines the need for pain medication now.  She endorses good fetal movement and states she has been hydrating well today.           OB History    Gravida Para Term Preterm AB Living   1             SAB TAB Ectopic Multiple Live Births                      Past Medical History:  Diagnosis Date  . Asthma   . Hypertension   . Seasonal allergies          Past Surgical History:  Procedure Laterality Date  . KNEE SURGERY Left   . NO PAST SURGERIES      History reviewed. No pertinent family history.      Social History  Substance Use Topics  . Smoking status: Never Smoker  . Smokeless tobacco: Never Used  . Alcohol use No    Allergies:       Allergies  Allergen Reactions  . Albuterol Swelling    swelling           Prescriptions Prior to Admission  Medication Sig Dispense Refill Last Dose  . Prenatal Vit-Fe Fumarate-FA (MULTIVITAMIN-PRENATAL) 27-0.8 MG TABS tablet Take 1 tablet by mouth daily at 12 noon.   02/18/2016 at Unknown time  . amoxicillin (AMOXIL) 875 MG tablet Take 1 tablet (875 mg total) by mouth 2 (two) times daily. (Patient not taking: Reported on 12/02/2015) 20 tablet 0 Not Taking at Unknown time  . diazepam (VALIUM) 2 MG tablet Take 1 tablet (2 mg total) by mouth 3 (three) times daily. (Patient not taking: Reported on 12/02/2015) 6 tablet 0 Not Taking at Unknown time  .  HYDROcodone-acetaminophen (NORCO) 5-325 MG tablet Take 1 tablet by mouth every 6 (six) hours as needed for moderate pain. (Patient not taking: Reported on 12/02/2015) 15 tablet 0 Not Taking at Unknown time  . meloxicam (MOBIC) 15 MG tablet Take 1 tablet (15 mg total) by mouth daily. Do not take Naprosyn or Motrin. (Patient not taking: Reported on 12/02/2015) 30 tablet 0 Not Taking at Unknown time  . metaxalone (SKELAXIN) 800 MG tablet Take 1 tablet (800 mg total) by mouth 3 (three) times daily. Begin taking only after finishing Valium for muscle spasm (Patient not taking: Reported on 02/01/2016) 21 tablet 0 Not Taking at Unknown time  . metaxalone (SKELAXIN) 800 MG tablet Take 1 tablet (800 mg total) by mouth 3 (three) times daily. Do not take with Valium and may use this with previous Skelaxin prescription (Patient not taking: Reported on 12/02/2015) 60 tablet 1 Not Taking at Unknown time    Review of Systems  Constitutional: Negative.   HENT: Negative.   Cardiovascular: Negative.   Gastrointestinal: Negative.   Endocrine: Negative.   Genitourinary:       Uterine  contractions every 3 minutes  Musculoskeletal: Negative.   Neurological: Negative.   Psychiatric/Behavioral: Negative.    Physical Exam   Blood pressure 109/89, pulse 91, temperature 97.7 F (36.5 C), temperature source Oral, resp. rate 18, last menstrual period 06/17/2015.  Physical Exam  Constitutional: She is oriented to person, place, and time. She appears well-developed and well-nourished.  Genitourinary:  Genitourinary Comments: Uterus: gravid, non-tender SVE: 1.5cm/50%/-3/vtx minimal bloody show present on glove   Neurological: She is alert and oriented to person, place, and time.  Skin: Skin is warm and dry.   Fetal monitoring: Baseline: 135 bpm/ moderate variability/ +accels/ no decelerations Toco: every 2-8 minutes with some UI/ palpating mild  Lab Results Last 24 Hours       Results for orders placed or  performed during the hospital encounter of 02/18/16 (from the past 24 hour(s))  Urinalysis, Complete w Microscopic     Status: Abnormal   Collection Time: 02/18/16 11:22 PM  Result Value Ref Range   Color, Urine YELLOW (A) YELLOW   APPearance CLEAR (A) CLEAR   Specific Gravity, Urine 1.008 1.005 - 1.030   pH 7.0 5.0 - 8.0   Glucose, UA NEGATIVE NEGATIVE mg/dL   Hgb urine dipstick NEGATIVE NEGATIVE   Bilirubin Urine NEGATIVE NEGATIVE   Ketones, ur 5 (A) NEGATIVE mg/dL   Protein, ur NEGATIVE NEGATIVE mg/dL   Nitrite NEGATIVE NEGATIVE   Leukocytes, UA MODERATE (A) NEGATIVE   RBC / HPF 0-5 0 - 5 RBC/hpf   WBC, UA 6-30 0 - 5 WBC/hpf   Bacteria, UA RARE (A) NONE SEEN   Squamous Epithelial / LPF 6-30 (A) NONE SEEN   Mucous PRESENT   Chlamydia/NGC rt PCR ([facility] only)     Status: None   Collection Time: 02/18/16 11:22 PM  Result Value Ref Range   Specimen source GC/Chlam VAGINA    Chlamydia Tr NOT DETECTED NOT DETECTED   N gonorrhoeae NOT DETECTED NOT DETECTED       Procedures   Assessment and Plan  1. IUP at 35+1 with threatened preterm labor    - Oral hydration    - UA - moderate leukocytes but no evidence of UTI     - GC/CH - negative,     - Continuous fetal monitoring  2. Category 1 Fetal tracing  3. Gestational Thrombocytopenia on 02/02/16 - platelets were 96      - F/u outpatient   Dr. Feliberto Gottron updated and aware of plan  Karena Addison 02/19/2016, 12:10 AM   Reassessment at 2:30am: Pt. Denies UCs for 1.5 hours and no further cervical change and denies current pain   1. D/C home with preterm labor precautions    - F/U at appt on Monday     - Pelvic rest for now    - FKC's daily 2. Category 1 Fetal tracing  3. Gestational Thrombocytopenia on 02/02/16 - platelets were 96      - F/u outpatient   Carlean Jews, CNM

## 2016-02-19 NOTE — OB Triage Note (Signed)
Cervix remains unchanged, patient denies contractions x90 minutes. Discharge instructions reviewed with patient and family members. Patient agrees to pelvic rest until follow up prenatal appointment, precautions explained. Patient and family members deny questions at this time. Ambulatory off unit in stable condition.

## 2016-02-19 NOTE — Discharge Instructions (Signed)
Drink plenty of fluids, rest frequently. Maintain pelvic rest until prenatal appointment on 02/20/16.

## 2016-02-20 LAB — OB RESULTS CONSOLE GBS: GBS: NEGATIVE

## 2016-03-18 ENCOUNTER — Inpatient Hospital Stay
Admission: EM | Admit: 2016-03-18 | Discharge: 2016-03-22 | DRG: 765 | Disposition: A | Discharge status: Home / Self Care | Payer: BLUE CROSS/BLUE SHIELD | Attending: Obstetrics and Gynecology | Admitting: Obstetrics and Gynecology

## 2016-03-18 ENCOUNTER — Inpatient Hospital Stay: Payer: BLUE CROSS/BLUE SHIELD | Admitting: Anesthesiology

## 2016-03-18 DIAGNOSIS — D509 Iron deficiency anemia, unspecified: Secondary | ICD-10-CM | POA: Diagnosis present

## 2016-03-18 DIAGNOSIS — O9952 Diseases of the respiratory system complicating childbirth: Secondary | ICD-10-CM | POA: Diagnosis present

## 2016-03-18 DIAGNOSIS — D6959 Other secondary thrombocytopenia: Secondary | ICD-10-CM | POA: Diagnosis present

## 2016-03-18 DIAGNOSIS — O324XX0 Maternal care for high head at term, not applicable or unspecified: Principal | ICD-10-CM | POA: Diagnosis present

## 2016-03-18 DIAGNOSIS — O2243 Hemorrhoids in pregnancy, third trimester: Secondary | ICD-10-CM | POA: Diagnosis present

## 2016-03-18 DIAGNOSIS — O4202 Full-term premature rupture of membranes, onset of labor within 24 hours of rupture: Secondary | ICD-10-CM | POA: Diagnosis present

## 2016-03-18 DIAGNOSIS — J45909 Unspecified asthma, uncomplicated: Secondary | ICD-10-CM | POA: Diagnosis present

## 2016-03-18 DIAGNOSIS — O411230 Chorioamnionitis, third trimester, not applicable or unspecified: Secondary | ICD-10-CM | POA: Diagnosis present

## 2016-03-18 DIAGNOSIS — O9081 Anemia of the puerperium: Secondary | ICD-10-CM | POA: Diagnosis not present

## 2016-03-18 DIAGNOSIS — O9912 Other diseases of the blood and blood-forming organs and certain disorders involving the immune mechanism complicating childbirth: Secondary | ICD-10-CM | POA: Diagnosis present

## 2016-03-18 DIAGNOSIS — Z3A39 39 weeks gestation of pregnancy: Secondary | ICD-10-CM

## 2016-03-18 DIAGNOSIS — Z98891 History of uterine scar from previous surgery: Secondary | ICD-10-CM

## 2016-03-18 DIAGNOSIS — D62 Acute posthemorrhagic anemia: Secondary | ICD-10-CM | POA: Diagnosis not present

## 2016-03-18 LAB — CHLAMYDIA/NGC RT PCR (ARMC ONLY)
CHLAMYDIA TR: NOT DETECTED
N GONORRHOEAE: NOT DETECTED

## 2016-03-18 LAB — CBC
HEMATOCRIT: 34.1 % — AB (ref 35.0–47.0)
HEMOGLOBIN: 11.1 g/dL — AB (ref 12.0–16.0)
MCH: 27.4 pg (ref 26.0–34.0)
MCHC: 32.6 g/dL (ref 32.0–36.0)
MCV: 84.2 fL (ref 80.0–100.0)
Platelets: 140 10*3/uL — ABNORMAL LOW (ref 150–440)
RBC: 4.05 MIL/uL (ref 3.80–5.20)
RDW: 13.4 % (ref 11.5–14.5)
WBC: 8.4 10*3/uL (ref 3.6–11.0)

## 2016-03-18 LAB — TYPE AND SCREEN
ABO/RH(D): B POS
ANTIBODY SCREEN: NEGATIVE

## 2016-03-18 MED ORDER — LIDOCAINE HCL (PF) 1 % IJ SOLN
INTRAMUSCULAR | Status: AC
  Filled 2016-03-18: qty 30

## 2016-03-18 MED ORDER — FENTANYL 2.5 MCG/ML W/ROPIVACAINE 0.2% IN NS 100 ML EPIDURAL INFUSION (ARMC-ANES)
EPIDURAL | Status: DC | PRN
  Administered 2016-03-18: 10 mL/h via EPIDURAL

## 2016-03-18 MED ORDER — FENTANYL 2.5 MCG/ML W/ROPIVACAINE 0.2% IN NS 100 ML EPIDURAL INFUSION (ARMC-ANES)
EPIDURAL | Status: AC
  Filled 2016-03-18: qty 100

## 2016-03-18 MED ORDER — TERBUTALINE SULFATE 1 MG/ML IJ SOLN
0.2500 mg | Freq: Once | INTRAMUSCULAR | Status: DC | PRN
  Filled 2016-03-18: qty 1

## 2016-03-18 MED ORDER — AMMONIA AROMATIC IN INHA
RESPIRATORY_TRACT | Status: AC
  Filled 2016-03-18: qty 10

## 2016-03-18 MED ORDER — ONDANSETRON HCL 4 MG/2ML IJ SOLN
4.0000 mg | Freq: Four times a day (QID) | INTRAMUSCULAR | Status: DC | PRN
  Administered 2016-03-18 – 2016-03-19 (×3): 4 mg via INTRAVENOUS
  Filled 2016-03-18 (×2): qty 2

## 2016-03-18 MED ORDER — LACTATED RINGERS IV SOLN
500.0000 mL | INTRAVENOUS | Status: DC | PRN
  Administered 2016-03-18: 500 mL via INTRAVENOUS

## 2016-03-18 MED ORDER — BUTORPHANOL TARTRATE 1 MG/ML IJ SOLN
1.0000 mg | INTRAMUSCULAR | Status: DC | PRN
  Administered 2016-03-18: 1 mg via INTRAVENOUS
  Filled 2016-03-18: qty 1

## 2016-03-18 MED ORDER — ACETAMINOPHEN 325 MG PO TABS
650.0000 mg | ORAL_TABLET | ORAL | Status: DC | PRN
  Administered 2016-03-19: 650 mg via ORAL
  Filled 2016-03-18: qty 2

## 2016-03-18 MED ORDER — OXYTOCIN 40 UNITS IN LACTATED RINGERS INFUSION - SIMPLE MED
2.5000 [IU]/h | INTRAVENOUS | Status: DC
  Administered 2016-03-19: 500 mL via INTRAVENOUS
  Administered 2016-03-19: 100 mL via INTRAVENOUS
  Filled 2016-03-18 (×2): qty 1000

## 2016-03-18 MED ORDER — OXYTOCIN BOLUS FROM INFUSION: 500.0000 mL | Freq: Once | INTRAVENOUS | Status: DC

## 2016-03-18 MED ORDER — LACTATED RINGERS IV SOLN
INTRAVENOUS | Status: DC
  Administered 2016-03-18 – 2016-03-19 (×3): via INTRAVENOUS

## 2016-03-18 MED ORDER — MISOPROSTOL 200 MCG PO TABS
ORAL_TABLET | ORAL | Status: AC
  Filled 2016-03-18: qty 4

## 2016-03-18 MED ORDER — BUPIVACAINE HCL (PF) 0.25 % IJ SOLN
INTRAMUSCULAR | Status: DC | PRN
  Administered 2016-03-18: 5 mL via EPIDURAL

## 2016-03-18 MED ORDER — OXYTOCIN 40 UNITS IN LACTATED RINGERS INFUSION - SIMPLE MED
1.0000 m[IU]/min | INTRAVENOUS | Status: DC
  Administered 2016-03-18: 1 m[IU]/min via INTRAVENOUS

## 2016-03-18 MED ORDER — LIDOCAINE HCL (PF) 1 % IJ SOLN: 30.0000 mL | INTRAMUSCULAR | Status: DC | PRN

## 2016-03-18 MED ORDER — OXYTOCIN 10 UNIT/ML IJ SOLN
INTRAMUSCULAR | Status: AC
  Filled 2016-03-18: qty 2

## 2016-03-18 MED ORDER — SOD CITRATE-CITRIC ACID 500-334 MG/5ML PO SOLN
30.0000 mL | ORAL | Status: DC | PRN
Start: 2016-03-18 — End: 2016-03-19

## 2016-03-18 NOTE — Progress Notes (Signed)
S:  Pt. Requesting epidural        Breathing well through contractions  O:  VS: Blood pressure 135/79, pulse 82, temperature 97.8 F (36.6 C), temperature source Oral, resp. rate 16, height 5\' 3"  (1.6 m), weight 73.5 kg (162 lb), last menstrual period 06/17/2015, SpO2 99 %.        FHR : baseline 115-120 / variability moderate / accelerations + / occasional variable decelerations - positive scalp stimulation        Toco: contractions every 1-2 minutes / strong         Cervix : Dilation: 8.5 Effacement (%): 100 Cervical Position: Middle Station: 0 Presentation: Vertex Exam by:: M Evania Lyne        Membranes: SROM - clear fluid at 0530am  A: Active labor     FHR category 2  P: Epidural       Anticipate NSVD soon  Carlean JewsMeredith Rico Massar, CNM

## 2016-03-18 NOTE — Progress Notes (Signed)
S:  Pt. Hurting with ctxs and requesting IV pain medication and cervical exam   O:  VS: Blood pressure 135/79, pulse 82, temperature 97.8 F (36.6 C), temperature source Oral, resp. rate 16, height 5\' 3"  (1.6 m), weight 73.5 kg (162 lb), last menstrual period 06/17/2015, SpO2 99 %.        FHR : baseline 110-115 bpm / variability minimal to moderate / accelerations none / occasional variables decelerations        Toco: contractions every 1-3 minutes / moderate-strong        Cervix : Dilation: 7.5 Effacement (%): 100 Cervical Position: Middle Station: 0 Presentation: Vertex Exam by:: M.Sigmon CNM         Membranes: SROM - clear fluid  A: Active labor     FHR category 2  P: Dr. Elesa MassedWard notified of baseline changes      Oxygen on, IV bolus infusing, Pitocin off     Anticipate NSVD soon  Carlean JewsMeredith Sigmon, CNM

## 2016-03-18 NOTE — Progress Notes (Signed)
S:  Pt. Still doing well, but feeling stronger contractions    Used nitrous oxide, but no longer working.  She has ambulated and used birth ball.  Doula, partner, and family still at bedside for support and performing low back massage.  Pt. Requesting IV pain medication   O:  VS: Blood pressure (!) 144/84, pulse 73, temperature 98 F (36.7 C), temperature source Oral, resp. rate 16, height 5\' 3"  (1.6 m), weight 73.5 kg (162 lb), last menstrual period 06/17/2015, SpO2 100 %.        FHR : baseline 115-120 bpm / variability moderate / accelerations + / no decelerations        Toco: contractions every 1-4 minutes / moderate /        Cervix : DECLINED exam         Membranes: SROM for clear fluid at 0530  A: Latent labor     FHR category 2  P: Continue Pitocin augmentation      Stadol PRN for pain     Reassess in 1-2 hours  Carlean JewsMeredith Sigmon, CNM

## 2016-03-18 NOTE — Anesthesia Preprocedure Evaluation (Signed)
Anesthesia Evaluation  Patient identified by MRN, date of birth, ID band Patient awake    Reviewed: Allergy & Precautions, NPO status , Patient's Chart, lab work & pertinent test results, reviewed documented beta blocker date and time   Airway Mallampati: II  TM Distance: >3 FB     Dental  (+) Chipped   Pulmonary asthma ,           Cardiovascular      Neuro/Psych    GI/Hepatic   Endo/Other    Renal/GU      Musculoskeletal   Abdominal   Peds  Hematology   Anesthesia Other Findings   Reproductive/Obstetrics                             Anesthesia Physical Anesthesia Plan  ASA: III  Anesthesia Plan: Epidural   Post-op Pain Management:    Induction:   Airway Management Planned:   Additional Equipment:   Intra-op Plan:   Post-operative Plan:   Informed Consent: I have reviewed the patients History and Physical, chart, labs and discussed the procedure including the risks, benefits and alternatives for the proposed anesthesia with the patient or authorized representative who has indicated his/her understanding and acceptance.     Plan Discussed with: CRNA  Anesthesia Plan Comments:         Anesthesia Quick Evaluation

## 2016-03-18 NOTE — Progress Notes (Addendum)
S:  Pt. Doing well with contractions - ambulating, birth ball PRN    Has continuous labor support with doula, partner, and mother   Rating pain 6/10 - has not required pain medication   O:  VS: Blood pressure 131/83, pulse 81, temperature 98 F (36.7 C), temperature source Oral, resp. rate 14, height 5\' 3"  (1.6 m), weight 73.5 kg (162 lb), last menstrual period 06/17/2015.        FHR : baseline 125 bpm / variability minimal to moderate / accelerations + / occasional variable decelerations        Toco: contractions every 7-10  minutes / moderate         Cervix : Dilation: 4 Effacement (%): 80 Cervical Position: Middle Station: -2 Presentation: Vertex Exam by:: J.Braddy RN        Membranes: SROM at 0530 am for clear fluid  A: Latent labor     FHR category 2  P: Continue current interventions     Discussed Pitocin augmentation - begin at 1 milliunit and increase by 2 milliunits     Reassess in 1-2 hours   Carlean JewsMeredith Jaziyah Gradel, CNM

## 2016-03-18 NOTE — Progress Notes (Signed)
Notified of Category 2 strip by CNM, Reviewed strip at time of call and again now    Current FHT is 120 mod no accels no decels Category 1  Continue to monitor closely  Anticipate SVD  ----- Ranae Plumberhelsea Madelaine Whipple, MD Attending Obstetrician and Gynecologist [facility], Department of OB/GYN [facility]

## 2016-03-18 NOTE — Anesthesia Procedure Notes (Signed)
Epidural Patient location during procedure: OB  Staffing Anesthesiologist: Berdine AddisonHOMAS, Annie Saephan Performed: anesthesiologist   Preanesthetic Checklist Completed: patient identified, site marked, surgical consent, pre-op evaluation, timeout performed, IV checked, risks and benefits discussed and monitors and equipment checked  Epidural Patient position: sitting Prep: Betadine Patient monitoring: heart rate, continuous pulse ox and blood pressure Approach: midline Location: L4-L5 Injection technique: LOR saline  Needle:  Needle type: Tuohy  Needle gauge: 18 G Needle length: 9 cm and 9 Catheter type: closed end flexible Catheter size: 20 Guage Test dose: negative and 1.5% lidocaine with Epi 1:200 K  Assessment Sensory level: T10 Events: blood not aspirated, injection not painful, no injection resistance, negative IV test and no paresthesia  Additional Notes   Patient tolerated the insertion well without complications.  1915 bolus 5 ml marcaine. 1920 Infusion start.Reason for block:procedure for pain

## 2016-03-18 NOTE — H&P (Signed)
OB ADMISSION/ HISTORY & PHYSICAL:  Admission Date: 03/18/2016  6:17 AM  Admit Diagnosis: 39+2 weeks spontaneous rupture of membranes  Sheri Lee is a 21 y.o. female G1P0 at 39+2 weeks presenting for spontaneous rupture of membranes at 0530am for clear fluid.    Prenatal History: G1P0   EDC : 03/23/2016, by Other Basis  [facility] Prenatal course complicated by  1. Hx. Of Chlamydia in pregnancy - TOC negative 2. Hx. Of Gestational thrombocytopenia - platelets increased on 02/20/16 to 132 3. 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 Asthma - uses Albuterol inhaler: will normally take it with Benadryl, but states she hasn't had swelling from it since a child  Prenatal Labs: ABO, Rh:  B Positive Antibody:  Negative Rubella:   Immune Varicella:  RPR:   Non-reactive HBsAg:   Negative HIV:   Negative GTT: 124 GBS:   Negative   Flu in season - 10/05/15 ([facility] Tdap at 27-36 weeks - 10/05/15 ([facility]) (AFP/tetra): Screen neg (OSB: 1:10,000, DSR: 1:1155, Trisomy 18 1:4499)  Medical / Surgical History :  Past medical history:  Past Medical History:  Diagnosis Date  . Asthma   . Seasonal allergies      Past surgical history:  Past Surgical History:  Procedure Laterality Date  . KNEE SURGERY Left   . NO PAST SURGERIES      Family History: History reviewed. No pertinent family history.   Social History:  reports that she has never smoked. She has never used smokeless tobacco. She reports that she does not drink alcohol or use drugs.   Allergies: Albuterol    Current Medications at time of admission:  Prior to Admission medications   Medication Sig Start Date End Date Taking? Authorizing Provider  Prenatal Vit-Fe Fumarate-FA (MULTIVITAMIN-PRENATAL) 27-0.8 MG TABS tablet Take 1 tablet by mouth daily at 12 noon.   Yes Historical Provider, MD  amoxicillin (AMOXIL) 875 MG tablet Take 1 tablet (875 mg total) by mouth 2  (two) times daily. Patient not taking: Reported on 12/02/2015 09/05/15   Payton Mccallumrlando Conty, MD  diazepam (VALIUM) 2 MG tablet Take 1 tablet (2 mg total) by mouth 3 (three) times daily. Patient not taking: Reported on 12/02/2015 10/07/14   Lutricia FeilWilliam P Roemer, PA-C  HYDROcodone-acetaminophen (NORCO) 5-325 MG tablet Take 1 tablet by mouth every 6 (six) hours as needed for moderate pain. Patient not taking: Reported on 12/02/2015 10/06/14   Irean HongJade J Sung, MD  meloxicam (MOBIC) 15 MG tablet Take 1 tablet (15 mg total) by mouth daily. Do not take Naprosyn or Motrin. Patient not taking: Reported on 12/02/2015 10/14/14   Hassan RowanEugene Wade, MD  metaxalone (SKELAXIN) 800 MG tablet Take 1 tablet (800 mg total) by mouth 3 (three) times daily. Begin taking only after finishing Valium for muscle spasm Patient not taking: Reported on 02/01/2016 10/07/14   Lutricia FeilWilliam P Roemer, PA-C  metaxalone (SKELAXIN) 800 MG tablet Take 1 tablet (800 mg total) by mouth 3 (three) times daily. Do not take with Valium and may use this with previous Skelaxin prescription Patient not taking: Reported on 12/02/2015 10/14/14   Hassan RowanEugene Wade, MD     Review of Systems: Active FM Ctx currently every 1-3 minutes LOF  / SROM @ 0530 for clear fluid bloody show present on Friday   Physical Exam:  VS: Blood pressure 127/84, pulse 90, temperature 98.1 F (36.7 C), temperature source Oral, resp. rate 16, height 5\' 3"  (1.6 m),  weight 73.5 kg (162 lb), last menstrual period 06/17/2015.  General: alert and oriented, appears calm Heart: RRR Lungs: Clear lung fields Abdomen: Gravid, soft and non-tender, non-distended / uterus: gravid, non-tender Extremities: no edema  Genitalia / VE: Dilation: 3 Effacement (%): 50, 60 Station: Ballotable  FHR: baseline rate 125 bpm / variability moderate / accelerations + / no decelerations TOCO: every 1-4 mintues  Assessment: 39+[redacted] weeks gestation Latent stage of labor FHR category 1   Plan:  1. Admit to Birth  Place for labor     - Routine labor and delivery orders    - Hx. Of chlamydia in pregnancy - GC/CT    - Pain management: IV stadol 1 mg IVP every 1 hour PRN, or epidural upon maternal request     - Continuous fetal monitoring  2. GBS Negative    - No prophylaxis indication 3. Postpartum:    - Breast    - Contraception: unsure, does not want Nexplanon 4. Anticipate NSVD  Dr. Elesa Massed notified of admission / plan of care  Carlean Jews, CNM

## 2016-03-18 NOTE — Progress Notes (Signed)
S:  Comfortable with the epidural,       Pitocin still off   O:  VS: Blood pressure (!) 113/58, pulse 76, temperature 97.6 F (36.4 C), temperature source Oral, resp. rate 16, height 5\' 3"  (1.6 m), weight 73.5 kg (162 lb), last menstrual period 06/17/2015, SpO2 100 %.        FHR : baseline 115 bpm / variability minimal to moderate / accelerations - rare  / occasional early decelerations, positive scalp stimulation at 2103        Toco: contractions every 2-6 minutes / strong         Cervix : Dilation: 9-Lip/rim Effacement (%): 100 Cervical Position: Middle Station: 0 Presentation: Vertex Bloody show Exam by:: M. Sigmon, CNM        Membranes: SROM at 5:30am  A: Active labor     FHR category 2  P: Continue expectant management    Will consider restarting Pitocin if not progressing      Anticipate NSVD  Carlean JewsMeredith Sigmon, CNM

## 2016-03-18 NOTE — OB Triage Note (Signed)
Pt arrived to unit with complaints of leaking fluid. Pt states she was awakened from her sleep by the sensation that she was peeing. States that clear fluid was gushing out. +FM, states that she had bloody discharge on Friday, no headaches or blurred vision. To evaluate.

## 2016-03-19 ENCOUNTER — Encounter
Admission: EM | Disposition: A | Discharge status: Home / Self Care | Payer: Self-pay | Source: Home / Self Care | Attending: Obstetrics and Gynecology

## 2016-03-19 LAB — RPR: RPR: NONREACTIVE

## 2016-03-19 SURGERY — Surgical Case
Anesthesia: Choice

## 2016-03-19 SURGERY — Surgical Case
Anesthesia: Epidural | Site: Abdomen | Wound class: Dirty or Infected

## 2016-03-19 MED ORDER — NALBUPHINE HCL 10 MG/ML IJ SOLN: 5.0000 mg | Freq: Once | INTRAMUSCULAR | Status: DC | PRN

## 2016-03-19 MED ORDER — MEPERIDINE HCL 25 MG/ML IJ SOLN: 6.2500 mg | INTRAMUSCULAR | Status: DC | PRN

## 2016-03-19 MED ORDER — SODIUM CHLORIDE 0.9% FLUSH: 3.0000 mL | INTRAVENOUS | Status: DC | PRN

## 2016-03-19 MED ORDER — BUPIVACAINE HCL (PF) 0.5 % IJ SOLN
INTRAMUSCULAR | Status: AC
  Filled 2016-03-19: qty 30

## 2016-03-19 MED ORDER — BUPIVACAINE HCL (PF) 0.25 % IJ SOLN
INTRAMUSCULAR | Status: AC
  Filled 2016-03-19: qty 30

## 2016-03-19 MED ORDER — BUPIVACAINE HCL 0.5 % IJ SOLN
INTRAMUSCULAR | Status: DC | PRN
  Administered 2016-03-19: 30 mL

## 2016-03-19 MED ORDER — NALOXONE HCL 2 MG/2ML IJ SOSY
1.0000 ug/kg/h | PREFILLED_SYRINGE | INTRAVENOUS | Status: DC | PRN
  Filled 2016-03-19: qty 2

## 2016-03-19 MED ORDER — DIPHENHYDRAMINE HCL 50 MG/ML IJ SOLN: 12.5000 mg | INTRAMUSCULAR | Status: DC | PRN

## 2016-03-19 MED ORDER — BUPIVACAINE IN DEXTROSE 0.75-8.25 % IT SOLN
INTRATHECAL | Status: DC | PRN
  Administered 2016-03-19: 1.7 mL via INTRATHECAL

## 2016-03-19 MED ORDER — PROPOFOL 10 MG/ML IV BOLUS
INTRAVENOUS | Status: AC
  Filled 2016-03-19: qty 20

## 2016-03-19 MED ORDER — NALBUPHINE HCL 10 MG/ML IJ SOLN: 5.0000 mg | INTRAMUSCULAR | Status: DC | PRN

## 2016-03-19 MED ORDER — SOD CITRATE-CITRIC ACID 500-334 MG/5ML PO SOLN
30.0000 mL | ORAL | Status: AC
  Administered 2016-03-19: 30 mL via ORAL

## 2016-03-19 MED ORDER — BUPIVACAINE HCL (PF) 0.5 % IJ SOLN: 30.0000 mL | Freq: Once | INTRAMUSCULAR | Status: DC

## 2016-03-19 MED ORDER — OXYTOCIN 40 UNITS IN LACTATED RINGERS INFUSION - SIMPLE MED
2.5000 [IU]/h | INTRAVENOUS | Status: AC
  Administered 2016-03-19: 2.5 [IU]/h via INTRAVENOUS

## 2016-03-19 MED ORDER — OXYCODONE HCL 5 MG PO TABS: 5.0000 mg | ORAL_TABLET | ORAL | Status: DC | PRN

## 2016-03-19 MED ORDER — BUPIVACAINE LIPOSOME 1.3 % IJ SUSP
INTRAMUSCULAR | Status: DC | PRN
  Administered 2016-03-19: 40 mL

## 2016-03-19 MED ORDER — KETOROLAC TROMETHAMINE 30 MG/ML IJ SOLN
30.0000 mg | Freq: Four times a day (QID) | INTRAMUSCULAR | Status: DC
  Administered 2016-03-19: 30 mg via INTRAVENOUS
  Filled 2016-03-19: qty 1

## 2016-03-19 MED ORDER — DIPHENHYDRAMINE HCL 25 MG PO CAPS: 25.0000 mg | ORAL_CAPSULE | ORAL | Status: DC | PRN

## 2016-03-19 MED ORDER — FENTANYL 2.5 MCG/ML W/ROPIVACAINE 0.2% IN NS 100 ML EPIDURAL INFUSION (ARMC-ANES): 10.0000 mL/h | EPIDURAL | Status: DC

## 2016-03-19 MED ORDER — LACTATED RINGERS IV SOLN: 500.0000 mL | Freq: Once | INTRAVENOUS | Status: DC

## 2016-03-19 MED ORDER — CLINDAMYCIN PHOSPHATE 900 MG/50ML IV SOLN
900.0000 mg | Freq: Once | INTRAVENOUS | Status: AC
  Administered 2016-03-19: 900 mg via INTRAVENOUS
  Filled 2016-03-19: qty 50

## 2016-03-19 MED ORDER — OXYCODONE HCL 5 MG PO TABS: 10.0000 mg | ORAL_TABLET | ORAL | Status: DC | PRN

## 2016-03-19 MED ORDER — SOD CITRATE-CITRIC ACID 500-334 MG/5ML PO SOLN
ORAL | Status: AC
  Administered 2016-03-19: 30 mL via ORAL
  Filled 2016-03-19: qty 15

## 2016-03-19 MED ORDER — WITCH HAZEL-GLYCERIN EX PADS
1.0000 "application " | MEDICATED_PAD | CUTANEOUS | Status: DC | PRN
  Administered 2016-03-20: 1 via TOPICAL
  Filled 2016-03-19 (×2): qty 100

## 2016-03-19 MED ORDER — SODIUM CHLORIDE 0.9% FLUSH: 3.0000 mL | Freq: Two times a day (BID) | INTRAVENOUS | Status: DC

## 2016-03-19 MED ORDER — DEXTROSE 5 % IV SOLN
500.0000 mg | Freq: Once | INTRAVENOUS | Status: AC
  Administered 2016-03-19: 500 mg via INTRAVENOUS
  Filled 2016-03-19: qty 500

## 2016-03-19 MED ORDER — ONDANSETRON HCL 4 MG/2ML IJ SOLN: 4.0000 mg | Freq: Three times a day (TID) | INTRAMUSCULAR | Status: DC | PRN

## 2016-03-19 MED ORDER — MENTHOL 3 MG MT LOZG
1.0000 | LOZENGE | OROMUCOSAL | Status: DC | PRN
  Filled 2016-03-19: qty 9

## 2016-03-19 MED ORDER — LACTATED RINGERS IV SOLN
INTRAVENOUS | Status: DC
  Administered 2016-03-19 – 2016-03-21 (×5): via INTRAVENOUS

## 2016-03-19 MED ORDER — PRENATAL MULTIVITAMIN CH
1.0000 | ORAL_TABLET | Freq: Every day | ORAL | Status: DC
  Administered 2016-03-19 – 2016-03-22 (×4): 1 via ORAL
  Filled 2016-03-19 (×4): qty 1

## 2016-03-19 MED ORDER — SODIUM CHLORIDE 0.9 % IV SOLN: INTRAVENOUS | Status: DC | PRN

## 2016-03-19 MED ORDER — IBUPROFEN 600 MG PO TABS
600.0000 mg | ORAL_TABLET | Freq: Four times a day (QID) | ORAL | Status: DC
  Administered 2016-03-20 – 2016-03-22 (×8): 600 mg via ORAL
  Filled 2016-03-19 (×9): qty 1

## 2016-03-19 MED ORDER — SODIUM CHLORIDE 0.9 % IJ SOLN
INTRAMUSCULAR | Status: DC | PRN
Start: 2016-03-19 — End: 2016-03-19
  Administered 2016-03-19: 40 mL via INTRAVENOUS

## 2016-03-19 MED ORDER — COCONUT OIL OIL: 1.0000 "application " | TOPICAL_OIL | Status: DC | PRN

## 2016-03-19 MED ORDER — ACETAMINOPHEN 500 MG PO TABS
1000.0000 mg | ORAL_TABLET | Freq: Four times a day (QID) | ORAL | Status: DC
  Administered 2016-03-19 – 2016-03-22 (×12): 1000 mg via ORAL
  Filled 2016-03-19 (×12): qty 2

## 2016-03-19 MED ORDER — DIPHENHYDRAMINE HCL 25 MG PO CAPS: 25.0000 mg | ORAL_CAPSULE | Freq: Four times a day (QID) | ORAL | Status: DC | PRN

## 2016-03-19 MED ORDER — MORPHINE SULFATE (PF) 0.5 MG/ML IJ SOLN
INTRAMUSCULAR | Status: DC | PRN
  Administered 2016-03-19: .2 mg via EPIDURAL

## 2016-03-19 MED ORDER — KETOROLAC TROMETHAMINE 30 MG/ML IJ SOLN: 30.0000 mg | Freq: Four times a day (QID) | INTRAMUSCULAR | Status: AC

## 2016-03-19 MED ORDER — FENTANYL 2.5 MCG/ML W/ROPIVACAINE 0.2% IN NS 100 ML EPIDURAL INFUSION (ARMC-ANES)
EPIDURAL | Status: AC
  Filled 2016-03-19: qty 100

## 2016-03-19 MED ORDER — MORPHINE SULFATE (PF) 0.5 MG/ML IJ SOLN
INTRAMUSCULAR | Status: AC
  Filled 2016-03-19: qty 10

## 2016-03-19 MED ORDER — ONDANSETRON HCL 4 MG/2ML IJ SOLN
INTRAMUSCULAR | Status: AC
  Filled 2016-03-19: qty 2

## 2016-03-19 MED ORDER — KETOROLAC TROMETHAMINE 30 MG/ML IJ SOLN: 30.0000 mg | Freq: Four times a day (QID) | INTRAMUSCULAR | Status: DC

## 2016-03-19 MED ORDER — NALBUPHINE HCL 10 MG/ML IJ SOLN
5.0000 mg | INTRAMUSCULAR | Status: DC | PRN
  Administered 2016-03-19: 5 mg via INTRAVENOUS
  Filled 2016-03-19: qty 1

## 2016-03-19 MED ORDER — SODIUM CHLORIDE 0.9 % IV SOLN
2.0000 g | Freq: Four times a day (QID) | INTRAVENOUS | Status: DC
  Administered 2016-03-19 – 2016-03-21 (×9): 2 g via INTRAVENOUS
  Filled 2016-03-19 (×12): qty 2000

## 2016-03-19 MED ORDER — DIBUCAINE 1 % RE OINT
1.0000 "application " | TOPICAL_OINTMENT | RECTAL | Status: DC | PRN
  Filled 2016-03-19: qty 28

## 2016-03-19 MED ORDER — EPHEDRINE SULFATE 50 MG/ML IJ SOLN
INTRAMUSCULAR | Status: DC | PRN
  Administered 2016-03-19 (×2): 10 mg via INTRAVENOUS

## 2016-03-19 MED ORDER — SODIUM CHLORIDE 0.9 % IJ SOLN
INTRAMUSCULAR | Status: AC
  Filled 2016-03-19: qty 50

## 2016-03-19 MED ORDER — SODIUM CHLORIDE 0.9 % IV SOLN: 250.0000 mL | INTRAVENOUS | Status: DC

## 2016-03-19 MED ORDER — SIMETHICONE 80 MG PO CHEW: 160.0000 mg | CHEWABLE_TABLET | Freq: Four times a day (QID) | ORAL | Status: DC | PRN

## 2016-03-19 MED ORDER — BUPIVACAINE LIPOSOME 1.3 % IJ SUSP
INTRAMUSCULAR | Status: AC
  Filled 2016-03-19: qty 20

## 2016-03-19 MED ORDER — ACETAMINOPHEN 325 MG PO TABS: 650.0000 mg | ORAL_TABLET | Freq: Four times a day (QID) | ORAL | Status: DC

## 2016-03-19 MED ORDER — KETOROLAC TROMETHAMINE 30 MG/ML IJ SOLN
30.0000 mg | Freq: Four times a day (QID) | INTRAMUSCULAR | Status: AC
  Administered 2016-03-19 – 2016-03-20 (×3): 30 mg via INTRAVENOUS
  Filled 2016-03-19 (×3): qty 1

## 2016-03-19 MED ORDER — PHENYLEPHRINE HCL 10 MG/ML IJ SOLN
INTRAMUSCULAR | Status: DC | PRN
  Administered 2016-03-19 (×15): 100 ug via INTRAVENOUS

## 2016-03-19 MED ORDER — NALOXONE HCL 0.4 MG/ML IJ SOLN: 0.4000 mg | INTRAMUSCULAR | Status: DC | PRN

## 2016-03-19 MED ORDER — BUPIVACAINE LIPOSOME 1.3 % IJ SUSP
20.0000 mL | Freq: Once | INTRAMUSCULAR | Status: DC
  Filled 2016-03-19: qty 20

## 2016-03-19 MED ORDER — AMPICILLIN SODIUM 2 G IJ SOLR
INTRAMUSCULAR | Status: AC
  Administered 2016-03-19: 2 g via INTRAVENOUS
  Filled 2016-03-19: qty 2000

## 2016-03-19 MED ORDER — GENTAMICIN SULFATE 40 MG/ML IJ SOLN
5.0000 mg/kg | Freq: Once | INTRAVENOUS | Status: AC
  Administered 2016-03-19: 370 mg via INTRAVENOUS
  Filled 2016-03-19: qty 9.25

## 2016-03-19 SURGICAL SUPPLY — 29 items
CANISTER SUCT 3000ML (MISCELLANEOUS) ×2 IMPLANT
CATH KIT ON-Q SILVERSOAK 5IN (CATHETERS) IMPLANT
DRSG TELFA 3X8 NADH (GAUZE/BANDAGES/DRESSINGS) IMPLANT
ELECT CAUTERY BLADE 6.4 (BLADE) ×2 IMPLANT
ELECT REM PT RETURN 9FT ADLT (ELECTROSURGICAL) ×2
ELECTRODE REM PT RTRN 9FT ADLT (ELECTROSURGICAL) ×1 IMPLANT
GAUZE SPONGE 4X4 12PLY STRL (GAUZE/BANDAGES/DRESSINGS) IMPLANT
GLOVE PI ORTHOPRO 6.5 (GLOVE) ×4
GLOVE PI ORTHOPRO STRL 6.5 (GLOVE) ×4 IMPLANT
GLOVE SURG SYN 6.5 ES PF (GLOVE) ×12 IMPLANT
GOWN STRL REUS W/ TWL LRG LVL3 (GOWN DISPOSABLE) ×4 IMPLANT
GOWN STRL REUS W/TWL LRG LVL3 (GOWN DISPOSABLE) ×4
LIQUID BAND (GAUZE/BANDAGES/DRESSINGS) ×4 IMPLANT
NS IRRIG 1000ML POUR BTL (IV SOLUTION) ×2 IMPLANT
PACK C SECTION AR (MISCELLANEOUS) ×2 IMPLANT
PAD OB MATERNITY 4.3X12.25 (PERSONAL CARE ITEMS) ×4 IMPLANT
PAD PREP 24X41 OB/GYN DISP (PERSONAL CARE ITEMS) ×2 IMPLANT
STRAP SAFETY BODY (MISCELLANEOUS) ×2 IMPLANT
STRIP CLOSURE SKIN 1/2X4 (GAUZE/BANDAGES/DRESSINGS) IMPLANT
SUT MNCRL 4-0 (SUTURE) ×1
SUT MNCRL 4-0 27XMFL (SUTURE) ×1
SUT PDS AB 1 TP1 96 (SUTURE) ×2 IMPLANT
SUT VIC AB 0 CT1 36 (SUTURE) ×4 IMPLANT
SUT VIC AB 2-0 CT1 27 (SUTURE) ×1
SUT VIC AB 2-0 CT1 TAPERPNT 27 (SUTURE) ×1 IMPLANT
SUT VIC AB 3-0 SH 27 (SUTURE) ×1
SUT VIC AB 3-0 SH 27X BRD (SUTURE) ×1 IMPLANT
SUTURE MNCRL 4-0 27XMF (SUTURE) ×1 IMPLANT
SWABSTK COMLB BENZOIN TINCTURE (MISCELLANEOUS) IMPLANT

## 2016-03-19 NOTE — Progress Notes (Signed)
S:  Pt. Pushing since 0230am. Maternal pushing effort not great.  She is uncomfortable and epidural not working.  We have tried lithotomy position, birth ball with tug of war, side-lying pushing, and hand/knees position.  She has significant labial edema and hemorrhoidal edema.   O:  VS: Blood pressure 138/75, pulse (!) 125, temperature 100.3 F (37.9 C), temperature source Oral, resp. rate 18, height 5\' 3"  (1.6 m), weight 73.5 kg (162 lb), last menstrual period 06/17/2015, SpO2 100 %.        FHR : baseline 145 bpm / variability moderate / accelerations + / early decelerations        Toco: contractions every 1-3 minutes / strong         Cervix : Dilation: 10 Dilation Complete Date: 03/19/16 Dilation Complete Time: 0218 Effacement (%): 100 Cervical Position: Middle Station: 0 Presentation: Vertex Exam by:: Joelene MillinM. Greogry Goodwyn, CNM     A: Active labor     FHR category 2  P: Will continue pushing efforts     Continuous labor support by me, RNs, doula, FOB, and mother     Hopeful for vaginal delivery  Carlean JewsMeredith Kamya Watling, CNM

## 2016-03-19 NOTE — Brief Op Note (Signed)
No fever at present. Discussed with patient and surgeon. If I can do a clean spinal, without blood, I will proceed.

## 2016-03-19 NOTE — Progress Notes (Addendum)
S: Pt. Very uncomfortable, pt. And family getting very discouraged through this process.  Discussed re-dosing epidural with patient and she agrees.    O:  VS: Blood pressure 136/80, pulse (!) 112, temperature 100.3 F (37.9 C), temperature source Oral, resp. rate 18, height 5\' 3"  (1.6 m), weight 73.5 kg (162 lb), last menstrual period 06/17/2015, SpO2 99 %.        FHR : baseline 145 bpm / variability minimal to moderate / accelerations no / occasional early decelerations        Toco: contractions every 1-4 minutes / moderate /        Cervix : Dilation: 10 Dilation Complete Date: 03/19/16 Dilation Complete Time: 0218 Effacement (%): 100 Cervical Position: Middle Station: 0 Presentation: Vertex Exam by:: Joelene MillinM. Kareen Hitsman, CNM Baby feels OP, but caput present and difficult to determine        A: Active labor     FHR category 2     Presumptive Chorioamnionitis - has been treated with 1 dose of Ampicillin and 1 dose of Gentamicin.   P: Epidural bolus and increased rate     Attempt to rest        Dr. Elesa MassedWard notified of arrest  Carlean JewsMeredith Germain Koopmann, CNM

## 2016-03-19 NOTE — Progress Notes (Signed)
I was called by CNM Sigmon to report that patient had been pushing for 3hrs without much descent, trying all positions and employing all safe techniques.  She remains at 0 station in spite of excellent maternal effort.    My exam concurs:  Complete, 0 station, OP- slightly right of midline, ample room posteriorly, unable to rotate head/fetus, oblique  Offered cesarean delivery to patient, and she agrees to proceed.   Consent reviewed and signed by me with risks and benefits explained: bleeding, infection, damage to nearby/adjacent organs, and blood clot.   OR, Anesthesia and staff notified, and are available.  Antibiotics given for chorioamnionitis: Ampicillin @ 02:20 and Gentamicin 5mg /kg @ 02:55. Confirmed with pharmacy that the Gentamicin dose/time given is sufficient to cover surgical prophyiaxis.  Will add Clindamycin and Azithromycin since membranes ruptured.    Will proceed when ready.   ----- Ranae Plumberhelsea Ward, MD Attending Obstetrician and Gynecologist [facility], Department of OB/GYN [facility]

## 2016-03-19 NOTE — Op Note (Signed)
Cesarean Section Procedure Note  03/18/2016 - 03/19/2016   Patient:  Sheri Lee  21 y.o. female at 2992w3d.  Patient's last menstrual period was 06/17/2015 (exact date). Preoperative diagnosis:  failure to descend Postoperative diagnosis:  failure to descend  PROCEDURE:  Procedure(s): CESAREAN SECTION (N/A) Surgeon:  Moishe SpiceSurgeon(s) and Role:    * Chelsea Salena Saner Ward, MD - Primary    *  Carlean JewsMeredith Sigmon, CNM - Assist Anesthesia:  spinal I/O: Total I/O In: 1187.5 [I.V.:1187.5] Out: 2375 [Urine:1375; Blood:1000] Specimens: placenta Complications: None Apparent Disposition:  VS stable to PACU  Findings: normal uterus, tubes and ovaries bilaterally, Live born female  Birth Weight: 7 lb 7.2 oz (3380 g) APGAR: 8, 9   Indication for procedure: 21 y.o. female at 2292w3d who presented with ROM at 39 weeks, progressed to complete with augmentation, and pushed for 3 hours with sufficient maternal effort.  Arrest of descent at 0 station.  Acynclitic fetal head.  Procedure Details   The risks, benefits, complications, treatment options, and expected outcomes were discussed with the patient. Informed consent was obtained. The patient was taken to Operating Room, identified as Sheri HectorShaneisha Mule and the procedure verified as a cesarean delivery.   After administration of anesthesia, the patient was prepped and draped in the usual sterile manner, including a vaginal prep. A surgical time out was performed, with the pediatric team present. After confirming adequate anesthesia, a Pfannenstiel incision was made and carried down through the subcutaneous tissue to the fascia. Fascial incision was made and extended transversely. The fascia was separated from the underlying rectus tissue superiorly and inferiorly. The peritoneum was identified and entered. Peritoneal incision was extended longitudinally.  A low transverse uterine incision was made.  Assistance was needed to push the head cephalad from the vagina to  facilitate delivery.  Delivered from cephalic presentation was a live born female, Ma'Ryiah . Delayed cord clamping was not performed. The umbilical cord was doubly clamped and cut, and the baby was handed off to the awaitng pediatrician.  The placenta was removed intact and appeared normal. The uterus was delivered from the abdominal cavity and cleared of clots, membranes, and debris. The uterus, tubes and ovaries appeared normal. The uterine incision was closed with running locking sutures of 0 Vicryl, and then a second, imbricating stitch was placed. Hemostasis was observed. The abdominal cavity was evacuated of extraneous fluid. The uterus was returned to the abdominal cavity and again the incision was inspected for hemostasis, which was confirmed.  The paracolic gutters were cleaned. The fascia was then reapproximated with running suture of vicryl. 60cc of Long- and short-acting bupivicaine was injected circumferentially into the fascia.  After a change of gloves, the subcutaneous tissue was irrigated and reapproximated with 3-0 vicryl. The skin was closed with 4-0 Monocryl and 40cc of long- and short-acting bupivacaine injected into the skin and subcutaneous tissues.  The incision was covered with surgical glue.     Instrument, sponge, and needle counts were correct prior the abdominal closure and at the conclusion of the case.   I was present and performed this procedure in its entirety.  ----- Ranae Plumberhelsea Ward, MD Attending Obstetrician and Gynecologist [facility], Department of OB/GYN [facility]

## 2016-03-19 NOTE — Anesthesia Post-op Follow-up Note (Cosign Needed)
Anesthesia QCDR form completed.        

## 2016-03-19 NOTE — Progress Notes (Signed)
S: Notified by RN of baseline FHR change to 170-180s and maternal fever of 103.48F.       She is starting to feel pressure   O:  VS: Blood pressure 122/88, pulse (!) 126, temperature (!) 103.1 F (39.5 C), temperature source Axillary, resp. rate 18, height 5\' 3"  (1.6 m), weight 73.5 kg (162 lb), last menstrual period 06/17/2015, SpO2 100 %.        FHR : baseline 165 bpm-170 bpm / variability moderate / accelerations + / no decelerations / +accels         Toco: contractions every 1-3 minutes / strong        Cervix : Dilation: Lip/rim Effacement (%): 100 Cervical Position: Middle Station: 0 Presentation: Vertex Exam by:: A. evans, RN Pt. Able to push passed anterior lip         Membranes: SROM 03/18/16 at 0530 am for clear fluid, currently still having clear fluid with bloody show        Uterus feels warm  A: Active  labor     FHR category 2    Presumptive chorioamnionitis   P: Tylenol 650mg  x 1 dose      Begin Ampicillin 2 grams every 6 hours, Gentamicin 5mg /kg x 1 dose and will continue 24 hours after delivery      Will plan to add Clindamycin postpartum        Will plan to have peds in attendance      Dr. Elesa MassedWard notified and consulted for plan of care  - okay to begin pushing   Carlean JewsMeredith Coburn Knaus, CNM

## 2016-03-19 NOTE — Anesthesia Procedure Notes (Signed)
Spinal  Patient location during procedure: OR Staffing Anesthesiologist: Berdine AddisonHOMAS, Zachry Hopfensperger Performed: anesthesiologist  Preanesthetic Checklist Completed: patient identified, site marked, surgical consent, pre-op evaluation, timeout performed, IV checked and risks and benefits discussed Spinal Block Patient position: sitting Prep: Betadine Patient monitoring: heart rate, cardiac monitor, continuous pulse ox and blood pressure Approach: midline Location: L3-4 Injection technique: single-shot Needle Needle type: Pencil-Tip  Needle gauge: 25 G Needle length: 9 cm Assessment Sensory level: T10 Additional Notes 0650 spinal with 1.757ml of marcaine.

## 2016-03-19 NOTE — Anesthesia Procedure Notes (Signed)
Date/Time: 03/19/2016 7:12 AM Performed by: Junious SilkNOLES, Emanuella Nickle Pre-anesthesia Checklist: Patient identified, Emergency Drugs available, Suction available, Patient being monitored and Timeout performed Oxygen Delivery Method: Simple face mask

## 2016-03-19 NOTE — Brief Op Note (Signed)
Pt not comfortable. Pain mostly one sided. Pulled catheter back 1 cm. % ml of marcaine injected thru catheter.

## 2016-03-19 NOTE — Transfer of Care (Signed)
Immediate Anesthesia Transfer of Care Note  Patient: Sheri Lee  Procedure(s) Performed: Procedure(s): CESAREAN SECTION (N/A)  Patient Location: PACU  Anesthesia Type:Spinal  Level of Consciousness: awake, alert  and oriented  Airway & Oxygen Therapy: Patient Spontanous Breathing  Post-op Assessment: Report given to RN and Post -op Vital signs reviewed and stable  Post vital signs: Reviewed and stable  Last Vitals:  Vitals:   03/19/16 0505 03/19/16 0654  BP: 140/70   Pulse: (!) 103   Resp:    Temp:  37.3 C    Last Pain:  Vitals:   03/19/16 0654  TempSrc: Oral  PainSc:          Complications: No apparent anesthesia complications and Patient re-intubated

## 2016-03-20 LAB — CBC
HEMATOCRIT: 25 % — AB (ref 35.0–47.0)
Hemoglobin: 8.1 g/dL — ABNORMAL LOW (ref 12.0–16.0)
MCH: 26.4 pg (ref 26.0–34.0)
MCHC: 32.3 g/dL (ref 32.0–36.0)
MCV: 81.8 fL (ref 80.0–100.0)
Platelets: 117 10*3/uL — ABNORMAL LOW (ref 150–440)
RBC: 3.06 MIL/uL — ABNORMAL LOW (ref 3.80–5.20)
RDW: 13.7 % (ref 11.5–14.5)
WBC: 20.5 10*3/uL — ABNORMAL HIGH (ref 3.6–11.0)

## 2016-03-20 LAB — SURGICAL PATHOLOGY

## 2016-03-20 MED ORDER — CLINDAMYCIN PHOSPHATE 900 MG/50ML IV SOLN
900.0000 mg | Freq: Three times a day (TID) | INTRAVENOUS | Status: DC
  Administered 2016-03-20 – 2016-03-21 (×2): 900 mg via INTRAVENOUS
  Filled 2016-03-20 (×4): qty 50

## 2016-03-20 MED ORDER — AMMONIA AROMATIC IN INHA
RESPIRATORY_TRACT | Status: AC
  Filled 2016-03-20: qty 10

## 2016-03-20 MED ORDER — GENTAMICIN SULFATE 40 MG/ML IJ SOLN
5.0000 mg/kg | INTRAVENOUS | Status: DC
  Administered 2016-03-20: 370 mg via INTRAVENOUS
  Filled 2016-03-20 (×2): qty 9.25

## 2016-03-20 NOTE — Anesthesia Post-op Follow-up Note (Signed)
  Anesthesia Pain Follow-up Note  Patient: Sheri Lee  Day #: 1  Date of Follow-up: 03/20/2016 Time: 8:45 AM  Last Vitals:  Vitals:   03/20/16 0310 03/20/16 0736  BP: 120/69 (!) 102/56  Pulse: (!) 101 84  Resp: 17 18  Temp: 36.9 C 36.8 C    Level of Consciousness: alert  Pain: none   Side Effects:None  Catheter Site Exam:clean, dry, no drainage     Plan: D/C from anesthesia care at surgeon's request  Karoline Caldwelleana Elveta Rape

## 2016-03-20 NOTE — Anesthesia Postprocedure Evaluation (Signed)
Anesthesia Post Note  Patient: Sheri HectorShaneisha Lee  Procedure(s) Performed: Procedure(s) (LRB): CESAREAN SECTION (N/A)  Patient location during evaluation: Mother Baby Anesthesia Type: Spinal Level of consciousness: awake, awake and alert and oriented Pain management: pain level controlled Vital Signs Assessment: post-procedure vital signs reviewed and stable Respiratory status: spontaneous breathing Cardiovascular status: blood pressure returned to baseline Postop Assessment: no headache, adequate PO intake, no backache and no signs of nausea or vomiting Anesthetic complications: no Comments: Epidural removed prior to spinal placement      Last Vitals:  Vitals:   03/20/16 0310 03/20/16 0736  BP: 120/69 (!) 102/56  Pulse: (!) 101 84  Resp: 17 18  Temp: 36.9 C 36.8 C    Last Pain:  Vitals:   03/20/16 0745  TempSrc:   PainSc: 3                  [name]

## 2016-03-20 NOTE — Progress Notes (Addendum)
Subjective: Postpartum Day 1: Cesarean Delivery Patient reports incisional pain.  Required bladder cath after Foley removal - 900ml by bladder scan and 500ml straight cath removed.  Since that time - from 2pm to 5pm, she has spontaneously voided 325ml. Still having iv fluids with iv abx for chorio.  Objective: Vital signs in last 24 hours: Temp:  [97.8 F (36.6 C)-98.6 F (37 C)] 98.6 F (37 C) (03/13 1655) Pulse Rate:  [80-108] 82 (03/13 1655) Resp:  [16-18] 18 (03/13 1655) BP: (102-125)/(56-73) 120/64 (03/13 1655) SpO2:  [98 %-99 %] 98 % (03/13 0736)  Physical Exam:  General: alert, cooperative, appears stated age and no distress Lochia: appropriate Uterine Fundus: firm, tender more than expected diffusely on exam Incision: healing well, no significant drainage, no dehiscence, no significant erythema DVT Evaluation: No evidence of DVT seen on physical exam. Negative Homan's sign. No cords or calf tenderness.   Recent Labs  03/18/16 0817 03/20/16 0459  HGB 11.1* 8.1*  HCT 34.1* 25.0*    Assessment/Plan: Status post Cesarean section. Postoperative course complicated by slow return to void, now resolved. Elevated WBC- repeat in am  Continue current care. Plan was to D/C abx when 24hr afebrile, but still having fundal tenderness. Will continue until tomorrow and will check WBC at that time.   Christeen DouglasBEASLEY, Sheri Cullimore 03/20/2016, 6:45 PM

## 2016-03-21 LAB — CBC
HEMATOCRIT: 21.8 % — AB (ref 35.0–47.0)
Hemoglobin: 7.2 g/dL — ABNORMAL LOW (ref 12.0–16.0)
MCH: 27.2 pg (ref 26.0–34.0)
MCHC: 33 g/dL (ref 32.0–36.0)
MCV: 82.5 fL (ref 80.0–100.0)
PLATELETS: 125 10*3/uL — AB (ref 150–440)
RBC: 2.64 MIL/uL — ABNORMAL LOW (ref 3.80–5.20)
RDW: 13.4 % (ref 11.5–14.5)
WBC: 16 10*3/uL — ABNORMAL HIGH (ref 3.6–11.0)

## 2016-03-21 MED ORDER — FERROUS SULFATE 325 (65 FE) MG PO TABS
325.0000 mg | ORAL_TABLET | Freq: Three times a day (TID) | ORAL | Status: DC
  Administered 2016-03-21 – 2016-03-22 (×4): 325 mg via ORAL
  Filled 2016-03-21 (×4): qty 1

## 2016-03-21 NOTE — Progress Notes (Signed)
POSTOPERATIVE DAY # 2 S/P LTCS for arrest of descent    S:         Reports feeling gppd             Tolerating po intake / no nausea / no vomiting / +passing flatus / no BM             Bleeding is light             Pain controlled with Motrin and Tylenol             Up ad lib / ambulatory/ voiding QS  Newborn breast feeding - going well   No dizziness    O:  VS: BP 113/75   Pulse 86   Temp 98 F (36.7 C) (Oral)   Resp 18   Ht 5\' 3"  (1.6 m)   Wt 73.5 kg (162 lb)   LMP 06/17/2015 (Exact Date)   SpO2 98%   Breastfeeding? Unknown   BMI 28.70 kg/m    LABS:               Recent Labs  03/20/16 0459 03/21/16 0444  WBC 20.5* 16.0*  HGB 8.1* 7.2*  PLT 117* 125*               Bloodtype: --/--/B POS (03/11 0817)  Rubella: Immune (08/02 0000)                                             I&O: Intake/Output      03/13 0701 - 03/14 0700 03/14 0701 - 03/15 0700   P.O. 1320    I.V. (mL/kg) 1450 (19.7)    IV Piggyback 250    Total Intake(mL/kg) 3020 (41.1)    Urine (mL/kg/hr) 1800 (1) 625 (3.6)   Blood     Total Output 1800 625   Net +1220 -625                     Physical Exam:             Alert and Oriented X3  Lungs: Clear and unlabored  Heart: regular rate and rhythm / no mumurs  Abdomen: soft, non-tender, non-distended , active bowel sounds in all 4 quadrants              Fundus: firm, non-tender, U-2             Dressing: none              Incision:  approximated with sutures / no erythema / no ecchymosis / no drainage  Perineum: intact, no significant edema or erythema  Lochia: appropriate  Extremities: noedema, no calf pain or tenderness,  A:        POD # 2 S/P LTCS for arrest of descent             ABL Anemia - begin Ferrous Sulfate   Chorioamnionitis - delivered, leukocytosis resolving, afebrile  P:        Routine postoperative care              D/C amp, gent, clinda  Okay to D/C IV  Ferrous Sulfate TID with meals   Anticipate discharge home  tomorrow  Carlean JewsMeredith Vlada Uriostegui, CNM

## 2016-03-22 DIAGNOSIS — Z98891 History of uterine scar from previous surgery: Secondary | ICD-10-CM

## 2016-03-22 DIAGNOSIS — D62 Acute posthemorrhagic anemia: Secondary | ICD-10-CM | POA: Diagnosis not present

## 2016-03-22 LAB — CBC
HCT: 23.9 % — ABNORMAL LOW (ref 35.0–47.0)
Hemoglobin: 7.7 g/dL — ABNORMAL LOW (ref 12.0–16.0)
MCH: 26.4 pg (ref 26.0–34.0)
MCHC: 32 g/dL (ref 32.0–36.0)
MCV: 82.3 fL (ref 80.0–100.0)
Platelets: 142 10*3/uL — ABNORMAL LOW (ref 150–440)
RBC: 2.91 MIL/uL — ABNORMAL LOW (ref 3.80–5.20)
RDW: 13.5 % (ref 11.5–14.5)
WBC: 10.3 10*3/uL (ref 3.6–11.0)

## 2016-03-22 MED ORDER — WITCH HAZEL-GLYCERIN EX PADS
1.0000 | MEDICATED_PAD | CUTANEOUS | 12 refills | Status: DC | PRN
Start: 2016-03-22 — End: 2017-11-19

## 2016-03-22 MED ORDER — OXYCODONE HCL 5 MG PO TABS: 5.0000 mg | ORAL_TABLET | ORAL | 0 refills | Status: DC | PRN

## 2016-03-22 MED ORDER — ACETAMINOPHEN 500 MG PO TABS: 1000.0000 mg | ORAL_TABLET | Freq: Four times a day (QID) | ORAL | 0 refills | Status: DC

## 2016-03-22 MED ORDER — IBUPROFEN 600 MG PO TABS: 600.0000 mg | ORAL_TABLET | Freq: Four times a day (QID) | ORAL | 2 refills | Status: DC

## 2016-03-22 MED ORDER — FERROUS SULFATE 325 (65 FE) MG PO TABS: 325.0000 mg | ORAL_TABLET | Freq: Three times a day (TID) | ORAL | 3 refills | Status: DC

## 2016-03-22 MED ORDER — DIBUCAINE 1 % RE OINT: 1.0000 "application " | TOPICAL_OINTMENT | RECTAL | 2 refills | Status: DC | PRN

## 2016-03-22 NOTE — Discharge Instructions (Signed)

## 2016-03-22 NOTE — Discharge Summary (Signed)
Obstetrical Discharge Summary  Patient Name: Sheri Lee DOB: Feb 08, 1995 MRN: [id number]  Date of Admission: 03/18/2016 Date of Delivery: 03/19/16 Delivered by: Ranae Plumberhelsea Merita Hawks, MD Date of Discharge: 03/22/2016  [facility]  WUJ:[id number]:Patient's last menstrual period was 06/17/2015 (exact date). EDC Estimated Date of Delivery: 03/23/16 Gestational Age at Delivery: [id number]   Antepartum complications:  Prenatal course complicated by  1. Hx. Of Chlamydia in pregnancy - TOC negative 2. Hx. Of Gestational thrombocytopenia - platelets increased on 02/20/16 to 132 3. 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 Asthma - uses Albuterol inhaler: will normally take it with Benadryl, but states she hasn't had swelling from it since a child  Admitting Diagnosis: rupture of membranes Secondary Diagnosis: Patient Active Problem List   Diagnosis Date Noted  . Acute blood loss anemia 03/22/2016  . S/P cesarean section 03/22/2016  . Labor and delivery, indication for care 03/18/2016    Augmentation: Pitocin Complications: Intrauterine Inflammation or infection (Chorioamniotis) Intrapartum complications/course: patient admitted with ROM and augmented with pitocin, developed chorioamnionitis and treated with antibiotics, progressed to complete with occasional category 2 strip, had a 3 hour second stage without descent of fetal head, and cesarean delivery was performed. Date of Delivery:  03/19/16 Delivered By: Leeroy Bockhelsea Christen Wardrop Delivery Type: primary cesarean section, low transverse incision Anesthesia: epidural then spinal Placenta: expressed Laceration: n/a Episiotomy: n/a Newborn Data: Live born female  Birth Weight: 7 lb 7.2 oz (3380 g) APGAR: 8, 9  Postpartum Procedures: none  Post partum course:    Patient had a postpartum course complicated by anemia with hemoglobin at 7.2, and continued antibiotics for chorioamnionitis due to leukocytosis. She was asymptomatic. By time of discharge on  POD#3, her pain was controlled on oral pain medications; she had appropriate lochia and was ambulating, voiding without difficulty, tolerating regular diet and passing flatus.  Both her anemia and leukocytosis were resolving upon discharge. She was deemed stable for discharge to home.    Discharge Physical Exam:  BP 137/87 (BP Location: Right Arm)   Pulse 77   Temp 98 F (36.7 C) (Oral)   Resp 20   Ht 5\' 3"  (1.6 m)   Wt 73.5 kg (162 lb)   LMP 06/17/2015 (Exact Date)   SpO2 98%   Breastfeeding? Unknown   BMI 28.70 kg/m   General: NAD CV: RRR Pulm: CTABL, nl effort ABD: s/nd/nt, fundus firm and below the umbilicus Lochia: moderate Incision: c/d/i DVT Evaluation: LE non-ttp, no evidence of DVT on exam.  Hemoglobin  Date Value Ref Range Status  03/22/2016 7.7 (L) 12.0 - 16.0 g/dL Final   HCT  Date Value Ref Range Status  03/22/2016 23.9 (L) 35.0 - 47.0 % Final     Disposition: stable, discharge to home. Baby Feeding: breastmilk/formula Baby Disposition: home with mom  Rh Immune globulin given: n/a Rubella vaccine given: n/a Tdap vaccine given in AP or PP setting: AP Flu vaccine given in AP or PP setting: AP  Contraception: TBD @ 6 week visit  Prenatal Labs:  Prenatal Labs: ABO, Rh:  B Positive Antibody:  Negative Rubella:   Immune Varicella:  RPR:   Non-reactive HBsAg:   Negative HIV:   Negative GTT: 124 GBS:   Negative   Flu in season [facility] Tdap at 27-36 weeks - 10/05/15 ([facility]) (AFP/tetra): Screen neg (OSB: 1:10,000, DSR: 1:1155, Trisomy 18 1:4499)    Plan:  Sheri Lee was discharged to home in good condition. Follow-up  appointment at [facility] in 2 weeks with Dr. Elesa Massed   Discharge Medications: Allergies as of 03/22/2016      Reactions   Albuterol Swelling   Swelling but not since a child      Medication List    STOP taking these medications   amoxicillin 875 MG  tablet Commonly known as:  AMOXIL   diazepam 2 MG tablet Commonly known as:  VALIUM   HYDROcodone-acetaminophen 5-325 MG tablet Commonly known as:  NORCO   meloxicam 15 MG tablet Commonly known as:  MOBIC   metaxalone 800 MG tablet Commonly known as:  SKELAXIN     TAKE these medications   acetaminophen 500 MG tablet Commonly known as:  TYLENOL Take 2 tablets (1,000 mg total) by mouth every 6 (six) hours.   dibucaine 1 % Oint Commonly known as:  NUPERCAINAL Place 1 application rectally as needed for hemorrhoids.   ferrous sulfate 325 (65 FE) MG tablet Take 1 tablet (325 mg total) by mouth 3 (three) times daily with meals.   ibuprofen 600 MG tablet Commonly known as:  ADVIL,MOTRIN Take 1 tablet (600 mg total) by mouth every 6 (six) hours.   multivitamin-prenatal 27-0.8 MG Tabs tablet Take 1 tablet by mouth daily at 12 noon.   oxyCODONE 5 MG immediate release tablet Commonly known as:  Oxy IR/ROXICODONE Take 1 tablet (5 mg total) by mouth every 4 (four) hours as needed (pain scale 4-7).   witch hazel-glycerin pad Commonly known as:  TUCKS Apply 1 application topically as needed for hemorrhoids.       Follow-up Information    Elenora Fender Xolani Degracia, MD Follow up in 2 week(s).   Specialty:  Obstetrics and Gynecology Contact information: [address] [facility] [place] [address] [phone]           Signed: ----- Ranae Plumber, MD Attending Obstetrician and Gynecologist [facility], Department of OB/GYN [facility]

## 2016-03-22 NOTE — Progress Notes (Signed)
D/C order from MD.  Reviewed d/c instructions and prescriptions with patient and answered any questions.  Patient d/c home with infant via wheelchair by nursing/auxillary. 

## 2016-04-25 ENCOUNTER — Ambulatory Visit
Admission: EM | Admit: 2016-04-25 | Discharge: 2016-04-25 | Disposition: A | Discharge status: Home / Self Care | Payer: BLUE CROSS/BLUE SHIELD | Attending: Family Medicine | Admitting: Family Medicine

## 2016-04-25 ENCOUNTER — Encounter: Payer: Self-pay | Admitting: *Deleted

## 2016-04-25 DIAGNOSIS — J069 Acute upper respiratory infection, unspecified: Secondary | ICD-10-CM

## 2016-04-25 DIAGNOSIS — J029 Acute pharyngitis, unspecified: Secondary | ICD-10-CM

## 2016-04-25 DIAGNOSIS — R509 Fever, unspecified: Secondary | ICD-10-CM

## 2016-04-25 LAB — RAPID STREP SCREEN (MED CTR MEBANE ONLY): Streptococcus, Group A Screen (Direct): NEGATIVE

## 2016-04-25 NOTE — ED Provider Notes (Signed)
[facility]    CSN: [id number] Arrival date & time: 04/25/16  1437     History   Chief Complaint Chief Complaint  Patient presents with  . Fever  . Otalgia  . Chills  . Sore Throat    HPI Sheri Lee is a 21 y.o. female.   The history is provided by the patient.  Fever  Associated symptoms: ear pain, rhinorrhea and sore throat   Associated symptoms: no headaches   Otalgia  Associated symptoms: fever, rhinorrhea and sore throat   Associated symptoms: no headaches   Sore Throat  Pertinent negatives include no headaches.  URI  Presenting symptoms: ear pain, fever, rhinorrhea and sore throat   Severity:  Moderate Onset quality:  Sudden Duration:  4 days Timing:  Constant Progression:  Worsening Chronicity:  New Relieved by:  None tried Ineffective treatments:  None tried Associated symptoms: no headaches and no wheezing   Risk factors: not elderly, no chronic cardiac disease, no chronic kidney disease, no chronic respiratory disease, no diabetes mellitus, no immunosuppression, no recent illness, no recent travel and no sick contacts     Past Medical History:  Diagnosis Date  . Asthma   . Seasonal allergies     Patient Active Problem List   Diagnosis Date Noted  . Acute blood loss anemia 03/22/2016  . S/P cesarean section 03/22/2016  . Labor and delivery, indication for care 03/18/2016    Past Surgical History:  Procedure Laterality Date  . CESAREAN SECTION N/A 03/19/2016   Procedure: CESAREAN SECTION;  Surgeon: Elenora Fender Ward, MD;  Location: [facility] ORS;  Service: Obstetrics;  Laterality: N/A;  . KNEE SURGERY Left   . NO PAST SURGERIES      OB History    Gravida Para Term Preterm AB Living   SAB TAB Ectopic Multiple Live Births         0 1       Home Medications    Prior to Admission medications   Medication Sig Start Date End Date Taking? Authorizing Provider  Prenatal Vit-Fe Fumarate-FA (MULTIVITAMIN-PRENATAL)  27-0.8 MG TABS tablet Take 1 tablet by mouth daily at 12 noon.   Yes Historical Provider, MD  acetaminophen (TYLENOL) 500 MG tablet Take 2 tablets (1,000 mg total) by mouth every 6 (six) hours. 03/22/16   Chelsea C Ward, MD  dibucaine (NUPERCAINAL) 1 % OINT Place 1 application rectally as needed for hemorrhoids. 03/22/16   Chelsea C Ward, MD  ferrous sulfate 325 (65 FE) MG tablet Take 1 tablet (325 mg total) by mouth 3 (three) times daily with meals. 03/22/16   Chelsea C Ward, MD  ibuprofen (ADVIL,MOTRIN) 600 MG tablet Take 1 tablet (600 mg total) by mouth every 6 (six) hours. 03/22/16   Chelsea C Ward, MD  oxyCODONE (OXY IR/ROXICODONE) 5 MG immediate release tablet Take 1 tablet (5 mg total) by mouth every 4 (four) hours as needed (pain scale 4-7). 03/22/16   Elenora Fender Ward, MD  witch hazel-glycerin (TUCKS) pad Apply 1 application topically as needed for hemorrhoids. 03/22/16   Elenora Fender Ward, MD    Family History History reviewed. No pertinent family history.  Social History Social History  Substance Use Topics  . Smoking status: Never Smoker  . Smokeless tobacco: Never Used  . Alcohol use No     Allergies   Albuterol   Review of Systems Review of Systems  Constitutional: Positive for fever.  HENT:  Positive for ear pain, rhinorrhea and sore throat.   Respiratory: Negative for wheezing.   Neurological: Negative for headaches.     Physical Exam Triage Vital Signs ED Triage Vitals  Enc Vitals Group     BP 04/25/16 1457 108/68     Pulse Rate 04/25/16 1457 98     Resp 04/25/16 1457 16     Temp 04/25/16 1457 99 F (37.2 C)     Temp Source 04/25/16 1457 Oral     SpO2 04/25/16 1457 100 %     Weight 04/25/16 1458 150 lb (68 kg)     Height 04/25/16 1458  (1.6 m)     Head Circumference --      Peak Flow --      Pain Score --      Pain Loc --      Pain Edu? --      Excl. in GC? --    No data found.   Updated Vital Signs BP 108/68 (BP Location: Right Arm)   Pulse 98    Temp 99 F (37.2 C) (Oral)   Resp 16   Ht  (1.6 m)   Wt 150 lb (68 kg)   SpO2 100%   BMI 26.57 kg/m   Visual Acuity Right Eye Distance:   Left Eye Distance:   Bilateral Distance:    Right Eye Near:   Left Eye Near:    Bilateral Near:     Physical Exam  Constitutional: She appears well-developed and well-nourished. No distress.  HENT:  Head: Normocephalic and atraumatic.  Right Ear: Tympanic membrane, external ear and ear canal normal.  Left Ear: Tympanic membrane, external ear and ear canal normal.  Nose: Rhinorrhea present. No mucosal edema, nose lacerations, sinus tenderness, nasal deformity, septal deviation or nasal septal hematoma. No epistaxis.  No foreign bodies. Right sinus exhibits no maxillary sinus tenderness and no frontal sinus tenderness. Left sinus exhibits no maxillary sinus tenderness and no frontal sinus tenderness.  Mouth/Throat: Uvula is midline, oropharynx is clear and moist and mucous membranes are normal. No oropharyngeal exudate.  Eyes: Conjunctivae and EOM are normal. Pupils are equal, round, and reactive to light. Right eye exhibits no discharge. Left eye exhibits no discharge. No scleral icterus.  Neck: Normal range of motion. Neck supple. No thyromegaly present.  Cardiovascular: Normal rate, regular rhythm and normal heart sounds.   Pulmonary/Chest: Effort normal and breath sounds normal. No respiratory distress. She has no wheezes. She has no rales.  Lymphadenopathy:    She has no cervical adenopathy.  Skin: She is not diaphoretic.  Nursing note and vitals reviewed.    UC Treatments / Results  Labs (all labs ordered are listed, but only abnormal results are displayed) Labs Reviewed  RAPID STREP SCREEN (NOT AT [facility])  CULTURE, GROUP A STREP [facility])    EKG  EKG Interpretation None       Radiology No results found.  Procedures Procedures (including critical care time)  Medications Ordered in UC Medications - No data to  display   Initial Impression / Assessment and Plan / UC Course  I have reviewed the triage vital signs and the nursing notes.  Pertinent labs & imaging results that were available during my care of the patient were reviewed by me and considered in my medical decision making (see chart for details).       Final Clinical Impressions(s) / UC Diagnoses   Final diagnoses:  Viral URI    New Prescriptions  New Prescriptions   No medications on file   1. Lab result (negative strep) and diagnosis reviewed with patient 2.Recommend supportive treatment with increased fluids, rest, otc analgesics prn 3. Follow-up prn if symptoms worsen or don't improve   Payton Mccallum, MD 04/25/16 1615

## 2016-04-25 NOTE — ED Triage Notes (Signed)
Fever, sore throat, bilat ear pain, x 3-5 days.

## 2016-04-28 ENCOUNTER — Telehealth: Payer: Self-pay

## 2016-04-28 LAB — CULTURE, GROUP A STREP (THRC)
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
## 2017-05-16 LAB — OB RESULTS CONSOLE VARICELLA ZOSTER ANTIBODY, IGG: VARICELLA IGG: IMMUNE

## 2017-05-16 LAB — OB RESULTS CONSOLE HIV ANTIBODY (ROUTINE TESTING): HIV: NONREACTIVE

## 2017-05-16 LAB — OB RESULTS CONSOLE RPR: RPR: NONREACTIVE

## 2017-05-16 LAB — OB RESULTS CONSOLE RUBELLA ANTIBODY, IGM: RUBELLA: IMMUNE

## 2017-05-16 LAB — OB RESULTS CONSOLE HEPATITIS B SURFACE ANTIGEN: Hepatitis B Surface Ag: NEGATIVE

## 2017-09-03 ENCOUNTER — Emergency Department
Admission: EM | Admit: 2017-09-03 | Discharge: 2017-09-03 | Disposition: A | Discharge status: Home / Self Care | Payer: BLUE CROSS/BLUE SHIELD | Attending: Emergency Medicine | Admitting: Emergency Medicine

## 2017-09-03 ENCOUNTER — Other Ambulatory Visit: Payer: Self-pay

## 2017-09-03 ENCOUNTER — Encounter: Payer: Self-pay | Admitting: Emergency Medicine

## 2017-09-03 DIAGNOSIS — J45909 Unspecified asthma, uncomplicated: Secondary | ICD-10-CM | POA: Insufficient documentation

## 2017-09-03 DIAGNOSIS — Z79899 Other long term (current) drug therapy: Secondary | ICD-10-CM | POA: Insufficient documentation

## 2017-09-03 DIAGNOSIS — O2242 Hemorrhoids in pregnancy, second trimester: Secondary | ICD-10-CM | POA: Diagnosis present

## 2017-09-03 DIAGNOSIS — Z3A26 26 weeks gestation of pregnancy: Secondary | ICD-10-CM | POA: Insufficient documentation

## 2017-09-03 DIAGNOSIS — O99512 Diseases of the respiratory system complicating pregnancy, second trimester: Secondary | ICD-10-CM | POA: Diagnosis not present

## 2017-09-03 DIAGNOSIS — K649 Unspecified hemorrhoids: Secondary | ICD-10-CM

## 2017-09-03 MED ORDER — LIDOCAINE-EPINEPHRINE 2 %-1:100000 IJ SOLN
INTRAMUSCULAR | Status: AC
  Administered 2017-09-03: 1.7 mL via INTRADERMAL
  Filled 2017-09-03: qty 1

## 2017-09-03 MED ORDER — LIDOCAINE-EPINEPHRINE (PF) 2 %-1:200000 IJ SOLN: 10.0000 mL | Freq: Once | INTRAMUSCULAR | Status: DC

## 2017-09-03 MED ORDER — LIDOCAINE-EPINEPHRINE 2 %-1:100000 IJ SOLN
1.7000 mL | Freq: Once | INTRAMUSCULAR | Status: AC
  Administered 2017-09-03: 1.7 mL via INTRADERMAL

## 2017-09-03 NOTE — ED Triage Notes (Signed)
Pt in via POV from home, pt is [redacted] weeks pregnant, denies any complications with pregnancy.  Here today with complaints of hemorrhoid flare up since Sunday, using prescription cream without any relief.  Vitals WDL, NAD noted at this time.

## 2017-09-03 NOTE — ED Provider Notes (Signed)
[facility] Emergency Department Provider Note ____________________________________________   First MD Initiated Contact with Patient 09/03/17 1744     (approximate)  I have reviewed the triage vital signs and the nursing notes.   HISTORY  Chief Complaint Hemorrhoids  HPI Zena Vitelli is a 22 y.o. female with a history of asthma and seasonal allergies who is presenting [redacted] weeks pregnant with painful hemorrhoids.  She did see Dr. Tonna Boehringer in the office about 2 weeks ago and was given a cream but was told to come back and see a provider if the pain or swelling of the hemorrhoids is worsened.  She says that she is taking stool softener and is not having difficulty passing stool.  However, despite this the hemorrhoids have gotten bigger and more painful.  She says that she feels the baby moving frequently.  Does not report any abdominal pain or vaginal discharge.  Past Medical History:  Diagnosis Date  . Asthma   . Seasonal allergies     Patient Active Problem List   Diagnosis Date Noted  . Acute blood loss anemia 03/22/2016  . S/P cesarean section 03/22/2016  . Labor and delivery, indication for care 03/18/2016    Past Surgical History:  Procedure Laterality Date  . CESAREAN SECTION N/A 03/19/2016   Procedure: CESAREAN SECTION;  Surgeon: Elenora Fender Ward, MD;  Location: [facility] ORS;  Service: Obstetrics;  Laterality: N/A;  . KNEE SURGERY Left   . NO PAST SURGERIES      Prior to Admission medications   Medication Sig Start Date End Date Taking? Authorizing Provider  acetaminophen (TYLENOL) 500 MG tablet Take 2 tablets (1,000 mg total) by mouth every 6 (six) hours. 03/22/16   Ward, Elenora Fender, MD  dibucaine (NUPERCAINAL) 1 % OINT Place 1 application rectally as needed for hemorrhoids. 03/22/16   Ward, Elenora Fender, MD  ferrous sulfate 325 (65 FE) MG tablet Take 1 tablet (325 mg total) by mouth 3 (three) times daily with meals. 03/22/16   Ward, Elenora Fender, MD    ibuprofen (ADVIL,MOTRIN) 600 MG tablet Take 1 tablet (600 mg total) by mouth every 6 (six) hours. 03/22/16   Ward, Elenora Fender, MD  oxyCODONE (OXY IR/ROXICODONE) 5 MG immediate release tablet Take 1 tablet (5 mg total) by mouth every 4 (four) hours as needed (pain scale 4-7). 03/22/16   Ward, Elenora Fender, MD  Prenatal Vit-Fe Fumarate-FA (MULTIVITAMIN-PRENATAL) 27-0.8 MG TABS tablet Take 1 tablet by mouth daily at 12 noon.    [provider]  witch hazel-glycerin (TUCKS) pad Apply 1 application topically as needed for hemorrhoids. 03/22/16   Ward, Elenora Fender, MD    Allergies Albuterol  No family history on file.  Social History Social History   Tobacco Use  . Smoking status: Never Smoker  . Smokeless tobacco: Never Used  Substance Use Topics  . Alcohol use: No  . Drug use: No    Review of Systems  Constitutional: No fever/chills Eyes: No visual changes. ENT: No sore throat. Cardiovascular: Denies chest pain. Respiratory: Denies shortness of breath. Gastrointestinal: No abdominal pain.  No nausea, no vomiting.  No diarrhea.  No constipation. Genitourinary: Negative for dysuria. Musculoskeletal: Negative for back pain. Skin: Negative for rash. Neurological: Negative for headaches, focal weakness or numbness.   ____________________________________________   PHYSICAL EXAM:  VITAL SIGNS: ED Triage Vitals [09/03/17 1723]  Enc Vitals Group     BP 112/63     Pulse Rate 86     Resp 16  Temp 98.3 F (36.8 C)     Temp Source Oral     SpO2 99 %     Weight 160 lb (72.6 kg)     Height 5\' 3"  (1.6 m)     Head Circumference      Peak Flow      Pain Score 9     Pain Loc      Pain Edu?      Excl. in GC?     Constitutional: Alert and oriented. Well appearing and in no acute distress. Eyes: Conjunctivae are normal.  Head: Atraumatic. Nose: No congestion/rhinnorhea. Mouth/Throat: Mucous membranes are moist.  Neck: No stridor.   Cardiovascular: Normal rate, regular  rhythm. Grossly normal heart sounds.   Respiratory: Normal respiratory effort.  No retractions. Lungs CTAB. Gastrointestinal: Soft and nontender.  Gravid abdomen with the fundus of the uterus approximately 6 cm above the umbilicus.Marland Kitchen. No CVA tenderness.  On external rectal exam multiple large engorged hemorrhoids which are not tense or ecchymotic.  No active bleeding.  Musculoskeletal: No lower extremity tenderness nor edema.  No joint effusions. Neurologic:  Normal speech and language. No gross focal neurologic deficits are appreciated. Skin:  Skin is warm, dry and intact. No rash noted. Psychiatric: Mood and affect are normal. Speech and behavior are normal.  ____________________________________________   LABS (all labs ordered are listed, but only abnormal results are displayed)  Labs Reviewed - No data to display ____________________________________________  EKG   ____________________________________________  RADIOLOGY   ____________________________________________   PROCEDURES  Procedure(s) performed:   Procedures  Critical Care performed:   ____________________________________________   INITIAL IMPRESSION / ASSESSMENT AND PLAN / ED COURSE  Pertinent labs & imaging results that were available during my care of the patient were reviewed by me and considered in my medical decision making (see chart for details).  DDX: Engorged hemorrhoids, painful hemorrhoids, rectal bleeding, thrombosed hemorrhoids As part of my medical decision making, I reviewed the following data within the electronic MEDICAL RECORD NUMBEROutpatient records  ----------------------------------------- 7:43 PM on 09/03/2017 -----------------------------------------  Hemorrhoids incised by Dr. Tonna BoehringerSakai at the bedside.  Bright red blood drained and hemorrhoids have decreased in size and the patient has relief of pain.  She will be discharged to home will continue using the cream as well as p.o. Tylenol  for pain control.  Patient understand the diagnosis as well as treatment and willing to comply.  ____________________________________________   FINAL CLINICAL IMPRESSION(S) / ED DIAGNOSES  Hemorrhoids.  NEW MEDICATIONS STARTED DURING THIS VISIT:  New Prescriptions   No medications on file     Note:  This document was prepared using Dragon voice recognition software and may include unintentional dictation errors.     Myrna BlazerSchaevitz, Patrese Neal Matthew, MD 09/03/17 [phone]

## 2017-09-03 NOTE — Consult Note (Signed)
Subjective:  CC: hemorrhoids  HPI:  Sheri Lee is a 22 y.o. female who was consulted by [facility] for evaluation of hemorrhoids. Symptoms were first noted 2 days ago. Pain is sharp, constant, worsening.  she has some rectal bleeding occurring when she wipes after BM   Associated with bleeding as above, exacerbated by BMs.  She had to leave work early today due to pain and presented to ED.  Currently [redacted]wks pregnant     Past Medical History:  has a past medical history of Asthma and Seasonal allergies.  Past Surgical History:  has a past surgical history that includes Knee surgery (Left); No past surgeries; and Cesarean section (N/A, 03/19/2016).  Family History: non-contributory  Social History:  reports that she has never smoked. She has never used smokeless tobacco. She reports that she does not drink alcohol or use drugs.  Current Medications:  (Not in a hospital admission)  Allergies:  Allergies as of 09/03/2017 - Review Complete 09/03/2017  Allergen Reaction Noted  . Albuterol Swelling 10/06/2014    ROS:  A 15 point review of systems was performed and was negative except as noted in HPI  Objective:     BP 112/63 (BP Location: Left Arm)   Pulse 86   Temp 98.3 F (36.8 C) (Oral)   Resp 16   Ht 5\' 3"  (1.6 m)   Wt 72.6 kg   SpO2 99%   BMI 28.34 kg/m   Constitutional :  alert, cooperative, appears stated age and no distress  Lymphatics/Throat::  no asymmetry, masses, or scars  Respiratory:  clear to auscultation bilaterally  Cardiovascular:  regular rate and rhythm, S1, S2 normal, no murmur, click, rub or gallop and regular rate and rhythm  Gastrointestinal: soft, non-tender; bowel sounds normal; no masses,  no organomegaly. Gravid uterus   Musculoskeletal: Steady gait and movement  Skin: Cool and moist  Psychiatric: Normal affect, non-agitated, not confused  Genital/Rectal: Chaperone present for exam.  External exam positive for large external  Hemorrhoids,  with no ulceration or firmness consistent with thrombosis, but patient is extremely tender to touch.  No other pathology visualized    LABS:  n/a  RADS: n/a Assessment:     External hemorrhoids Plan:    1.enlarged hemorrhoids, but not certain if true thombosis exists based on physical exam.  Due to worsening pain, patient is willing to undergo incision and drainage to see if any clot is able to be evacuated.  Risks include bleeding, recurrence, infection, continued pain.  Benefits include pain relief is clot is noted.  Alternatives include symptomatic management.  Excision in OR not worth it due to pregnancy status.  She agrees to bedside incision.    Preoperative diagnosis: thrombosed external hemorrhoid Postoperative diagnosis: same  Procedure: Incision and drainage of thrombosed hemorrohoid Anesthesia: local  Surgeon: Tonna Boehringer, DO  Wound Classification: Contaminated  Indications: Patient is a 22 y.o. female  presented with thrombosed external hemorrhoids.  See H&P for further details.  Findings:  1. thrombosed external hemorrhoids 2. Immediate decrease in size after incision and drainage of blood from area. 3. Adequate hemostasis.   Description of procedure: The patient was placed in the right lateral position and local anesthesia was induced. The area was prepped and draped in the usual sterile fashion. A timeout was completed verifying correct patient, procedure, site, positioning, and implant(s) and/or special equipment prior to beginning this procedure.  Local infused into base of hemorrhoid and within the engorged external hemorrhoids x2.  Inicision at apex  of each engorged hemorrhoid and immediate drainage of blood noted, roughly 4ml total.  Immediate decompression of hemorrhoidal tissue noted.  Wounds probed to ensure complete drainage.  Pressure applied until hemostasis achieved.  Hemostasis confirmed and area dressed with 4x4. The patient tolerated the procedure well.  She  will be sent home with tylenol.  Advised continue monitoring and apply pressure to area if bleeding noted.  F/u in office as needed.   Specimen: none  Complications: None  Estimated Blood Loss: 5mL

## 2017-11-14 LAB — OB RESULTS CONSOLE GC/CHLAMYDIA
Chlamydia: NEGATIVE
Gonorrhea: NEGATIVE

## 2017-11-14 LAB — OB RESULTS CONSOLE GBS: STREP GROUP B AG: NEGATIVE

## 2017-11-14 LAB — OB RESULTS CONSOLE RPR: RPR: NONREACTIVE

## 2017-11-16 ENCOUNTER — Other Ambulatory Visit: Payer: Self-pay

## 2017-11-16 ENCOUNTER — Inpatient Hospital Stay
Admission: EM | Admit: 2017-11-16 | Discharge: 2017-11-19 | DRG: 783 | Disposition: A | Discharge status: Home / Self Care | Payer: BLUE CROSS/BLUE SHIELD | Attending: Obstetrics & Gynecology | Admitting: Obstetrics & Gynecology

## 2017-11-16 ENCOUNTER — Encounter: Payer: Self-pay | Admitting: Certified Nurse Midwife

## 2017-11-16 DIAGNOSIS — D696 Thrombocytopenia, unspecified: Secondary | ICD-10-CM | POA: Diagnosis present

## 2017-11-16 DIAGNOSIS — Z3A36 36 weeks gestation of pregnancy: Secondary | ICD-10-CM

## 2017-11-16 DIAGNOSIS — M545 Low back pain, unspecified: Secondary | ICD-10-CM | POA: Diagnosis present

## 2017-11-16 DIAGNOSIS — O9912 Other diseases of the blood and blood-forming organs and certain disorders involving the immune mechanism complicating childbirth: Secondary | ICD-10-CM | POA: Diagnosis present

## 2017-11-16 DIAGNOSIS — O34211 Maternal care for low transverse scar from previous cesarean delivery: Principal | ICD-10-CM | POA: Diagnosis present

## 2017-11-16 DIAGNOSIS — Z302 Encounter for sterilization: Secondary | ICD-10-CM

## 2017-11-16 DIAGNOSIS — D649 Anemia, unspecified: Secondary | ICD-10-CM | POA: Diagnosis present

## 2017-11-16 DIAGNOSIS — O9902 Anemia complicating childbirth: Secondary | ICD-10-CM | POA: Diagnosis present

## 2017-11-16 MED ORDER — ACETAMINOPHEN 500 MG PO TABS
1000.0000 mg | ORAL_TABLET | Freq: Four times a day (QID) | ORAL | Status: DC | PRN
  Administered 2017-11-17: 1000 mg via ORAL
  Filled 2017-11-16: qty 2

## 2017-11-16 MED ORDER — CYCLOBENZAPRINE HCL 10 MG PO TABS
5.0000 mg | ORAL_TABLET | Freq: Once | ORAL | Status: AC | PRN
  Administered 2017-11-17: 5 mg via ORAL
  Filled 2017-11-16: qty 1

## 2017-11-16 NOTE — Progress Notes (Signed)
Sheri Lee is a 22 y.o. female. She is at [redacted]w[redacted]d gestation. Patient's last menstrual period was 02/12/2017. Estimated Date of Delivery: 12/10/17  Prenatal care site: [facility]  Chief complaint: low back pain Location: lower back Onset/timing: 11/16/17 around 1400 Duration: intermittent, getting worse over time Severity: 8/10 at its worst Aggravating or alleviating conditions: none Associated signs/symptoms: none Context: Sheri Lee reports a lower back pain that started around. She reports that the pain is in her lower back, but wraps around to her lower abdomen at times, with some pelvic pressure and pain at times. The pain comes and goes, but has gotten worse over time. Nothing has seemed to make the pain any better or worse, other than time. She tried taking a warm bath, which did not seem to help at all. It feels like what she felt labor contractions to be in her first pregnancy.   S: Resting comfortably.   She reports:  -active fetal movement -no leakage of fluid  -no vaginal bleeding  Maternal Medical History:   Past Medical History:  Diagnosis Date  . Asthma   . Seasonal allergies     Past Surgical History:  Procedure Laterality Date  . CESAREAN SECTION N/A 03/19/2016   Procedure: CESAREAN SECTION;  Surgeon: Elenora Fender Ward, MD;  Location: [facility] ORS;  Service: Obstetrics;  Laterality: N/A;  . KNEE SURGERY Left   . NO PAST SURGERIES      Allergies  Allergen Reactions  . Albuterol Swelling    Swelling but not since a child    Prior to Admission medications   Medication Sig Start Date End Date Taking? Authorizing Provider  ferrous sulfate 325 (65 FE) MG tablet Take 1 tablet (325 mg total) by mouth 3 (three) times daily with meals. 03/22/16  Yes Ward, Elenora Fender, MD  Prenatal Vit-Fe Fumarate-FA (MULTIVITAMIN-PRENATAL) 27-0.8 MG TABS tablet Take 1 tablet by mouth daily at 12 noon.   Yes [provider]  acetaminophen (TYLENOL) 500 MG tablet Take  2 tablets (1,000 mg total) by mouth every 6 (six) hours. 03/22/16   Ward, Elenora Fender, MD  dibucaine (NUPERCAINAL) 1 % OINT Place 1 application rectally as needed for hemorrhoids. 03/22/16   Ward, Elenora Fender, MD  ibuprofen (ADVIL,MOTRIN) 600 MG tablet Take 1 tablet (600 mg total) by mouth every 6 (six) hours. 03/22/16   Ward, Elenora Fender, MD  oxyCODONE (OXY IR/ROXICODONE) 5 MG immediate release tablet Take 1 tablet (5 mg total) by mouth every 4 (four) hours as needed (pain scale 4-7). 03/22/16   Ward, Elenora Fender, MD  witch hazel-glycerin (TUCKS) pad Apply 1 application topically as needed for hemorrhoids. 03/22/16   Ward, Elenora Fender, MD     Social History: She  reports that she has never smoked. She has never used smokeless tobacco. She reports that she does not drink alcohol or use drugs.  Family History: family history includes Allergies in her paternal grandfather; Anemia in her mother; Asthma in her brother; Colon cancer in her maternal grandmother; Diabetes in her maternal grandfather; Hypertension in her father, maternal aunt, maternal grandfather, maternal uncle, and mother; Lupus in her father and maternal uncle.   Review of Systems: A full review of systems was performed and negative except as noted in the HPI.    O:  LMP 02/12/2017  Results for orders placed or performed during the hospital encounter of 11/16/17 (from the past 48 hour(s))  Urinalysis, Complete w Microscopic   Collection Time: 11/17/17 12:28 AM  Result  Value Ref Range   Color, Urine YELLOW (A) YELLOW   APPearance CLEAR (A) CLEAR   Specific Gravity, Urine 1.023 1.005 - 1.030   pH 7.0 5.0 - 8.0   Glucose, UA NEGATIVE NEGATIVE mg/dL   Hgb urine dipstick NEGATIVE NEGATIVE   Bilirubin Urine NEGATIVE NEGATIVE   Ketones, ur 20 (A) NEGATIVE mg/dL   Protein, ur NEGATIVE NEGATIVE mg/dL   Nitrite NEGATIVE NEGATIVE   Leukocytes, UA TRACE (A) NEGATIVE   RBC / HPF 0-5 0 - 5 RBC/hpf   WBC, UA 0-5 0 - 5 WBC/hpf   Bacteria, UA RARE (A)  NONE SEEN   Squamous Epithelial / LPF 0-5 0 - 5   Mucus PRESENT      Constitutional: NAD, AAOx3, appears quite comfortable HE/ENT: extraocular movements grossly intact, moist mucous membranes CV: RRR PULM: normal respiratory effort, CTABL     Abd: gravid, non-tender, non-distended, soft  Back: symmetric, no CVAT     Ext: Non-tender, nonedmeatous   Psych: mood appropriate, speech normal Pelvic: 1cm/long/-3 posterior/medium at 2345  Fetal tracing:  Baseline: 130 Variability: moderate Accelerations: 15x15 present x >2 Decelerations: absent Time: Toco: uterine irritability with occasional contractions   A/P: 22 y.o. [redacted]w[redacted]d here for antenatal surveillance during pregnancy.   Fetal Wellbeing  Reassuring for GA  Low back pain  1000mg  Tylenol and 5mg  Flexeril given, PO hydration, and heating pad applied to lower back. Patient reports that pain is getting worse and now feeling it in her ribs in addition to her low back and lower abdomen.   UA with no evidence of acute UTI.   Plan to monitor and recheck cervix in 1-2 hours.    Genia Del 11/17/2017 1:03 AM  ----- Genia Del, CNM Certified Nurse Midwife [facility], Department of OB/GYN [facility]

## 2017-11-17 ENCOUNTER — Other Ambulatory Visit: Payer: Self-pay

## 2017-11-17 ENCOUNTER — Encounter
Admission: EM | Disposition: A | Discharge status: Home / Self Care | Payer: Self-pay | Source: Home / Self Care | Attending: Obstetrics & Gynecology

## 2017-11-17 ENCOUNTER — Inpatient Hospital Stay: Payer: BLUE CROSS/BLUE SHIELD | Admitting: Anesthesiology

## 2017-11-17 ENCOUNTER — Encounter: Payer: Self-pay | Admitting: Certified Nurse Midwife

## 2017-11-17 DIAGNOSIS — D649 Anemia, unspecified: Secondary | ICD-10-CM | POA: Diagnosis present

## 2017-11-17 DIAGNOSIS — O34211 Maternal care for low transverse scar from previous cesarean delivery: Secondary | ICD-10-CM | POA: Diagnosis present

## 2017-11-17 DIAGNOSIS — O9912 Other diseases of the blood and blood-forming organs and certain disorders involving the immune mechanism complicating childbirth: Secondary | ICD-10-CM | POA: Diagnosis present

## 2017-11-17 DIAGNOSIS — Z3A36 36 weeks gestation of pregnancy: Secondary | ICD-10-CM | POA: Diagnosis not present

## 2017-11-17 DIAGNOSIS — D696 Thrombocytopenia, unspecified: Secondary | ICD-10-CM | POA: Diagnosis present

## 2017-11-17 DIAGNOSIS — Z302 Encounter for sterilization: Secondary | ICD-10-CM | POA: Diagnosis not present

## 2017-11-17 DIAGNOSIS — O9902 Anemia complicating childbirth: Secondary | ICD-10-CM | POA: Diagnosis present

## 2017-11-17 LAB — URINALYSIS, COMPLETE (UACMP) WITH MICROSCOPIC
Bilirubin Urine: NEGATIVE
Glucose, UA: NEGATIVE mg/dL
HGB URINE DIPSTICK: NEGATIVE
Ketones, ur: 20 mg/dL — AB
NITRITE: NEGATIVE
PH: 7 (ref 5.0–8.0)
Protein, ur: NEGATIVE mg/dL
SPECIFIC GRAVITY, URINE: 1.023 (ref 1.005–1.030)

## 2017-11-17 LAB — CBC
HCT: 32 % — ABNORMAL LOW (ref 36.0–46.0)
Hemoglobin: 9.9 g/dL — ABNORMAL LOW (ref 12.0–15.0)
MCH: 27.6 pg (ref 26.0–34.0)
MCHC: 30.9 g/dL (ref 30.0–36.0)
MCV: 89.1 fL (ref 80.0–100.0)
NRBC: 0 % (ref 0.0–0.2)
Platelets: 95 10*3/uL — ABNORMAL LOW (ref 150–400)
RBC: 3.59 MIL/uL — AB (ref 3.87–5.11)
RDW: 12.7 % (ref 11.5–15.5)
WBC: 5.8 10*3/uL (ref 4.0–10.5)

## 2017-11-17 LAB — TYPE AND SCREEN
ABO/RH(D): B POS
ANTIBODY SCREEN: NEGATIVE

## 2017-11-17 LAB — PLATELET COUNT: Platelets: 94 10*3/uL — ABNORMAL LOW (ref 150–400)

## 2017-11-17 SURGERY — Surgical Case
Anesthesia: Spinal

## 2017-11-17 MED ORDER — BUPIVACAINE IN DEXTROSE 0.75-8.25 % IT SOLN
INTRATHECAL | Status: DC | PRN
  Administered 2017-11-17: 11.25 mL via INTRATHECAL

## 2017-11-17 MED ORDER — DIPHENHYDRAMINE HCL 25 MG PO CAPS: 25.0000 mg | ORAL_CAPSULE | ORAL | Status: DC | PRN

## 2017-11-17 MED ORDER — SODIUM CHLORIDE (PF) 0.9 % IJ SOLN
INTRAMUSCULAR | Status: AC
  Filled 2017-11-17: qty 50

## 2017-11-17 MED ORDER — ACETAMINOPHEN 500 MG PO TABS
1000.0000 mg | ORAL_TABLET | Freq: Four times a day (QID) | ORAL | Status: DC
  Administered 2017-11-17 – 2017-11-19 (×8): 1000 mg via ORAL
  Filled 2017-11-17 (×9): qty 2

## 2017-11-17 MED ORDER — BUPIVACAINE LIPOSOME 1.3 % IJ SUSP
INTRAMUSCULAR | Status: DC | PRN
  Administered 2017-11-17: 50 mL

## 2017-11-17 MED ORDER — NALBUPHINE HCL 10 MG/ML IJ SOLN: 5.0000 mg | Freq: Once | INTRAMUSCULAR | Status: DC | PRN

## 2017-11-17 MED ORDER — ACETAMINOPHEN 650 MG RE SUPP
650.0000 mg | Freq: Once | RECTAL | Status: AC
  Administered 2017-11-17: 650 mg via RECTAL
  Filled 2017-11-17 (×2): qty 1

## 2017-11-17 MED ORDER — SODIUM CHLORIDE 0.9% FLUSH: 3.0000 mL | Freq: Two times a day (BID) | INTRAVENOUS | Status: DC

## 2017-11-17 MED ORDER — MENTHOL 3 MG MT LOZG
1.0000 | LOZENGE | OROMUCOSAL | Status: DC | PRN
  Filled 2017-11-17: qty 9

## 2017-11-17 MED ORDER — SODIUM CHLORIDE 0.9% FLUSH: 3.0000 mL | INTRAVENOUS | Status: DC | PRN

## 2017-11-17 MED ORDER — OXYCODONE HCL 5 MG PO TABS: 10.0000 mg | ORAL_TABLET | ORAL | Status: DC | PRN

## 2017-11-17 MED ORDER — ACETAMINOPHEN 325 MG PO TABS: 650.0000 mg | ORAL_TABLET | Freq: Four times a day (QID) | ORAL | Status: DC

## 2017-11-17 MED ORDER — SODIUM CHLORIDE FLUSH 0.9 % IV SOLN
INTRAVENOUS | Status: AC
  Filled 2017-11-17: qty 10

## 2017-11-17 MED ORDER — KETOROLAC TROMETHAMINE 30 MG/ML IJ SOLN: 30.0000 mg | Freq: Four times a day (QID) | INTRAMUSCULAR | Status: DC

## 2017-11-17 MED ORDER — OXYTOCIN 40 UNITS IN LACTATED RINGERS INFUSION - SIMPLE MED
2.5000 [IU]/h | INTRAVENOUS | Status: AC
  Administered 2017-11-18: 2.5 [IU]/h via INTRAVENOUS
  Filled 2017-11-17: qty 1000

## 2017-11-17 MED ORDER — COCONUT OIL OIL
1.0000 "application " | TOPICAL_OIL | Status: DC | PRN
  Administered 2017-11-19: 1 via TOPICAL
  Filled 2017-11-17: qty 120

## 2017-11-17 MED ORDER — SOD CITRATE-CITRIC ACID 500-334 MG/5ML PO SOLN
ORAL | Status: AC
  Administered 2017-11-17: 30 mL
  Filled 2017-11-17: qty 15

## 2017-11-17 MED ORDER — DIBUCAINE 1 % RE OINT: 1.0000 "application " | TOPICAL_OINTMENT | RECTAL | Status: DC | PRN

## 2017-11-17 MED ORDER — SOD CITRATE-CITRIC ACID 500-334 MG/5ML PO SOLN
ORAL | Status: AC
  Filled 2017-11-17: qty 15

## 2017-11-17 MED ORDER — SIMETHICONE 80 MG PO CHEW: 160.0000 mg | CHEWABLE_TABLET | Freq: Four times a day (QID) | ORAL | Status: DC | PRN

## 2017-11-17 MED ORDER — SODIUM CHLORIDE 0.9 % IV SOLN: 250.0000 mL | INTRAVENOUS | Status: DC

## 2017-11-17 MED ORDER — WITCH HAZEL-GLYCERIN EX PADS: 1.0000 "application " | MEDICATED_PAD | CUTANEOUS | Status: DC | PRN

## 2017-11-17 MED ORDER — LACTATED RINGERS IV SOLN
500.0000 mL | INTRAVENOUS | Status: DC | PRN
  Administered 2017-11-17: 500 mL via INTRAVENOUS

## 2017-11-17 MED ORDER — MISOPROSTOL 200 MCG PO TABS
ORAL_TABLET | ORAL | Status: AC
  Filled 2017-11-17: qty 4

## 2017-11-17 MED ORDER — OXYCODONE HCL 5 MG PO TABS: 5.0000 mg | ORAL_TABLET | ORAL | Status: DC | PRN

## 2017-11-17 MED ORDER — OXYTOCIN 40 UNITS IN LACTATED RINGERS INFUSION - SIMPLE MED
2.5000 [IU]/h | INTRAVENOUS | Status: DC
  Administered 2017-11-17: 40 mL via INTRAVENOUS
  Filled 2017-11-17: qty 1000

## 2017-11-17 MED ORDER — MORPHINE SULFATE (PF) 0.5 MG/ML IJ SOLN
INTRAMUSCULAR | Status: DC | PRN
  Administered 2017-11-17: .1 mg via EPIDURAL

## 2017-11-17 MED ORDER — OXYTOCIN BOLUS FROM INFUSION: 500.0000 mL | Freq: Once | INTRAVENOUS | Status: DC

## 2017-11-17 MED ORDER — OXYCODONE HCL 5 MG/5ML PO SOLN: 5.0000 mg | Freq: Once | ORAL | Status: DC | PRN

## 2017-11-17 MED ORDER — SODIUM CHLORIDE 0.9 % IV SOLN
INTRAVENOUS | Status: DC | PRN
  Administered 2017-11-17: 50 ug/min via INTRAVENOUS

## 2017-11-17 MED ORDER — PHENYLEPHRINE HCL 10 MG/ML IJ SOLN
INTRAMUSCULAR | Status: DC | PRN
  Administered 2017-11-17: 50 ug via INTRAVENOUS
  Administered 2017-11-17: 100 ug via INTRAVENOUS

## 2017-11-17 MED ORDER — MEPERIDINE HCL 25 MG/ML IJ SOLN: 6.2500 mg | INTRAMUSCULAR | Status: DC | PRN

## 2017-11-17 MED ORDER — CEFAZOLIN SODIUM-DEXTROSE 2-3 GM-%(50ML) IV SOLR
INTRAVENOUS | Status: DC | PRN
  Administered 2017-11-17: 2 g via INTRAVENOUS

## 2017-11-17 MED ORDER — CEFAZOLIN SODIUM-DEXTROSE 2-4 GM/100ML-% IV SOLN
2.0000 g | INTRAVENOUS | Status: DC
  Filled 2017-11-17: qty 100

## 2017-11-17 MED ORDER — DIPHENHYDRAMINE HCL 50 MG/ML IJ SOLN: 12.5000 mg | INTRAMUSCULAR | Status: DC | PRN

## 2017-11-17 MED ORDER — BUPIVACAINE HCL (PF) 0.5 % IJ SOLN
INTRAMUSCULAR | Status: AC
  Filled 2017-11-17: qty 30

## 2017-11-17 MED ORDER — OXYCODONE HCL 5 MG PO TABS: 5.0000 mg | ORAL_TABLET | Freq: Once | ORAL | Status: DC | PRN

## 2017-11-17 MED ORDER — DIPHENHYDRAMINE HCL 25 MG PO CAPS: 25.0000 mg | ORAL_CAPSULE | Freq: Four times a day (QID) | ORAL | Status: DC | PRN

## 2017-11-17 MED ORDER — LACTATED RINGERS IV SOLN
INTRAVENOUS | Status: DC
  Administered 2017-11-18: 08:00:00 via INTRAVENOUS

## 2017-11-17 MED ORDER — EPHEDRINE SULFATE 50 MG/ML IJ SOLN
INTRAMUSCULAR | Status: DC | PRN
  Administered 2017-11-17: 5 mg via INTRAVENOUS

## 2017-11-17 MED ORDER — SODIUM CHLORIDE 0.9 % IV SOLN
INTRAVENOUS | Status: AC | PRN
  Administered 2017-11-17: 50 mL via INTRAMUSCULAR

## 2017-11-17 MED ORDER — ONDANSETRON HCL 4 MG/2ML IJ SOLN: 4.0000 mg | Freq: Three times a day (TID) | INTRAMUSCULAR | Status: DC | PRN

## 2017-11-17 MED ORDER — LACTATED RINGERS IV SOLN
INTRAVENOUS | Status: DC
  Administered 2017-11-17 (×2): via INTRAVENOUS

## 2017-11-17 MED ORDER — AMMONIA AROMATIC IN INHA
RESPIRATORY_TRACT | Status: AC
  Filled 2017-11-17: qty 10

## 2017-11-17 MED ORDER — FENTANYL CITRATE (PF) 100 MCG/2ML IJ SOLN: 25.0000 ug | INTRAMUSCULAR | Status: DC | PRN

## 2017-11-17 MED ORDER — LIDOCAINE HCL (PF) 1 % IJ SOLN
INTRAMUSCULAR | Status: AC
  Filled 2017-11-17: qty 30

## 2017-11-17 MED ORDER — OXYTOCIN 10 UNIT/ML IJ SOLN
INTRAMUSCULAR | Status: AC
  Filled 2017-11-17: qty 2

## 2017-11-17 MED ORDER — ONDANSETRON HCL 4 MG/2ML IJ SOLN
INTRAMUSCULAR | Status: DC | PRN
  Administered 2017-11-17: 4 mg via INTRAVENOUS

## 2017-11-17 MED ORDER — FENTANYL CITRATE (PF) 100 MCG/2ML IJ SOLN
INTRAMUSCULAR | Status: DC | PRN
  Administered 2017-11-17: 15 ug via INTRAVENOUS

## 2017-11-17 MED ORDER — IBUPROFEN 600 MG PO TABS
600.0000 mg | ORAL_TABLET | Freq: Four times a day (QID) | ORAL | Status: DC
  Administered 2017-11-17 – 2017-11-19 (×8): 600 mg via ORAL
  Filled 2017-11-17 (×8): qty 1

## 2017-11-17 MED ORDER — KETOROLAC TROMETHAMINE 30 MG/ML IJ SOLN
INTRAMUSCULAR | Status: DC | PRN
  Administered 2017-11-17: 30 mg via INTRAVENOUS

## 2017-11-17 MED ORDER — NALBUPHINE HCL 10 MG/ML IJ SOLN: 5.0000 mg | INTRAMUSCULAR | Status: DC | PRN

## 2017-11-17 MED ORDER — NALOXONE HCL 0.4 MG/ML IJ SOLN: 0.4000 mg | INTRAMUSCULAR | Status: DC | PRN

## 2017-11-17 MED ORDER — BUPIVACAINE LIPOSOME 1.3 % IJ SUSP
20.0000 mL | Freq: Once | INTRAMUSCULAR | Status: DC
  Filled 2017-11-17: qty 20

## 2017-11-17 MED ORDER — PRENATAL MULTIVITAMIN CH
1.0000 | ORAL_TABLET | Freq: Every day | ORAL | Status: DC
  Administered 2017-11-18: 1 via ORAL
  Filled 2017-11-17: qty 1

## 2017-11-17 SURGICAL SUPPLY — 32 items
BINDER ABDOMINAL  9 SM 30-45 (SOFTGOODS) ×1
BINDER ABDOMINAL 9 SM 30-45 (SOFTGOODS) ×1 IMPLANT
CANISTER SUCT 3000ML PPV (MISCELLANEOUS) ×2 IMPLANT
COVER WAND RF STERILE (DRAPES) IMPLANT
DERMABOND ADVANCED (GAUZE/BANDAGES/DRESSINGS) ×1
DERMABOND ADVANCED .7 DNX12 (GAUZE/BANDAGES/DRESSINGS) ×1 IMPLANT
DRSG TELFA 3X8 NADH (GAUZE/BANDAGES/DRESSINGS) ×2 IMPLANT
ELECT CAUTERY BLADE 6.4 (BLADE) ×2 IMPLANT
ELECT REM PT RETURN 9FT ADLT (ELECTROSURGICAL) ×2
ELECTRODE REM PT RTRN 9FT ADLT (ELECTROSURGICAL) ×1 IMPLANT
GAUZE SPONGE 4X4 12PLY STRL (GAUZE/BANDAGES/DRESSINGS) ×2 IMPLANT
GLOVE PI ORTHOPRO 6.5 (GLOVE) ×7
GLOVE PI ORTHOPRO STRL 6.5 (GLOVE) ×7 IMPLANT
GLOVE SURG SYN 6.5 ES PF (GLOVE) ×2 IMPLANT
GOWN STRL REUS W/ TWL LRG LVL3 (GOWN DISPOSABLE) ×3 IMPLANT
GOWN STRL REUS W/TWL LRG LVL3 (GOWN DISPOSABLE) ×3
NS IRRIG 1000ML POUR BTL (IV SOLUTION) ×2 IMPLANT
PACK C SECTION AR (MISCELLANEOUS) ×2 IMPLANT
PAD OB MATERNITY 4.3X12.25 (PERSONAL CARE ITEMS) ×2 IMPLANT
PAD PREP 24X41 OB/GYN DISP (PERSONAL CARE ITEMS) ×2 IMPLANT
STRAP SAFETY 5IN WIDE (MISCELLANEOUS) ×2 IMPLANT
STRIP CLOSURE SKIN 1/2X4 (GAUZE/BANDAGES/DRESSINGS) ×2 IMPLANT
SUT MNCRL 4-0 (SUTURE) ×1
SUT MNCRL 4-0 27XMFL (SUTURE) ×1
SUT PDS AB 1 TP1 96 (SUTURE) ×2 IMPLANT
SUT VIC AB 0 CT1 36 (SUTURE) ×4 IMPLANT
SUT VIC AB 2-0 CT1 27 (SUTURE) ×1
SUT VIC AB 2-0 CT1 TAPERPNT 27 (SUTURE) ×1 IMPLANT
SUT VIC AB 3-0 SH 27 (SUTURE) ×3
SUT VIC AB 3-0 SH 27X BRD (SUTURE) ×3 IMPLANT
SUTURE MNCRL 4-0 27XMF (SUTURE) ×1 IMPLANT
SWABSTK COMLB BENZOIN TINCTURE (MISCELLANEOUS) IMPLANT

## 2017-11-17 NOTE — Anesthesia Procedure Notes (Signed)
Spinal  Patient location during procedure: OR Start time: 11/17/2017 11:07 AM End time: 11/17/2017 11:10 AM Staffing Resident/CRNA: Summer Mccolgan L, CRNA Performed: resident/CRNA  Preanesthetic Checklist Completed: patient identified, surgical consent, pre-op evaluation, timeout performed, IV checked, risks and benefits discussed and monitors and equipment checked Spinal Block Patient position: sitting Prep: ChloraPrep Patient monitoring: heart rate, continuous pulse ox and blood pressure Approach: midline Location: L2-3 Injection technique: single-shot Needle Needle type: Whitacre  Needle gauge: 25 G Assessment Sensory level: T4

## 2017-11-17 NOTE — Progress Notes (Signed)
Progress Note  Sheri Lee is a 22 y.o. G2P1001 at [redacted]w[redacted]d by ultrasound admitted for low back pain/contractions.  Subjective:  Patient reports that she is still feeling painful contractions, now with sharp shooting pain in her back, every 7-8 minutes.   Objective: BP 116/71   Pulse 83   LMP 02/12/2017   Fetal Assessment: FHT:  FHR: 130 bpm, variability: moderate,  accelerations:  Present,  decelerations:  Absent Category/reactivity:  Category I UC:   Irregular, every 6-12 minutes when tracing SVE:    Dilation: 3cm  Effacement: 50%  Station:  -3  Consistency: medium  Position: posterior  Membrane status: intact Amniotic color: n/a  Labs: Lab Results  Component Value Date   WBC 5.8 11/17/2017   HGB 9.9 (L) 11/17/2017   HCT 32.0 (L) 11/17/2017   MCV 89.1 11/17/2017   PLT 95 (L) 11/17/2017    Assessment / Plan: Early labor  Labor: cervix with change since last exam  Thrombocytopenia: Platelet count 95k from 100k on 11/14/17 Fetal Wellbeing:  Category I Anticipated MOD:  planned repeat LTCS   Discussed updated status with Dr. Leeroy Bock Ward, who advises that delivery will likely be the course of action. Repeat platelet count ordered, as level may impact type of anesthesia. Will also call the blood bank to ascertain the availability of platelets.    Genia Del, CNM 11/17/2017, 7:00 AM

## 2017-11-17 NOTE — Transfer of Care (Signed)
Immediate Anesthesia Transfer of Care Note  Patient: Sheri Lee  Procedure(s) Performed: CESAREAN SECTION (N/A )  Patient Location: PACU and Labor and delivery  Anesthesia Type:Spinal  Level of Consciousness: awake, alert  and oriented  Airway & Oxygen Therapy: Patient Spontanous Breathing  Post-op Assessment: Report given to RN and Post -op Vital signs reviewed and stable  Post vital signs: Reviewed and stable  Last Vitals:  Vitals Value Taken Time  BP 120/77 11/17/2017 12:55 PM  Temp    Pulse 69 11/17/2017 12:55 PM  Resp 14 11/17/2017 12:55 PM  SpO2 100 % 11/17/2017 12:55 PM    Last Pain:  Vitals:   11/17/17 1255  TempSrc:   PainSc: 0-No pain      Patients Stated Pain Goal: 0 (11/16/17 2256)  Complications: No apparent anesthesia complications

## 2017-11-17 NOTE — H&P (Signed)
OB History & Physical   History of Present Illness:  Chief Complaint:   HPI:  Sheri Lee is a 22 y.o. G2P1001 female at [redacted]w[redacted]d dated by [redacted]w[redacted]d ultrasound.  She presents to L&D for lower back pain and contractions.   She reports:  -active fetal movement -no leakage of fluid -no vaginal bleeding -onset of contractions at 1400 11/16/2017 currently every 7-8 minutes  Pregnancy Issues: 1. History of prior low transverse cesarean section 2. Asthma 3. Anemia 4. Thrombocytopenia, worsened in pregnancy   Maternal Medical History:   Past Medical History:  Diagnosis Date  . Asthma   . Seasonal allergies     Past Surgical History:  Procedure Laterality Date  . CESAREAN SECTION N/A 03/19/2016   Procedure: CESAREAN SECTION;  Surgeon: Elenora Fender Ward, MD;  Location: [facility] ORS;  Service: Obstetrics;  Laterality: N/A;  . KNEE SURGERY Left   . NO PAST SURGERIES      Allergies  Allergen Reactions  . Albuterol Swelling    Swelling but not since a child    Prior to Admission medications   Medication Sig Start Date End Date Taking? Authorizing Provider  ferrous sulfate 325 (65 FE) MG tablet Take 1 tablet (325 mg total) by mouth 3 (three) times daily with meals. 03/22/16  Yes Ward, Elenora Fender, MD  Prenatal Vit-Fe Fumarate-FA (MULTIVITAMIN-PRENATAL) 27-0.8 MG TABS tablet Take 1 tablet by mouth daily at 12 noon.   Yes [provider]  acetaminophen (TYLENOL) 500 MG tablet Take 2 tablets (1,000 mg total) by mouth every 6 (six) hours. 03/22/16   Ward, Elenora Fender, MD  dibucaine (NUPERCAINAL) 1 % OINT Place 1 application rectally as needed for hemorrhoids. 03/22/16   Ward, Elenora Fender, MD  ibuprofen (ADVIL,MOTRIN) 600 MG tablet Take 1 tablet (600 mg total) by mouth every 6 (six) hours. 03/22/16   Ward, Elenora Fender, MD  oxyCODONE (OXY IR/ROXICODONE) 5 MG immediate release tablet Take 1 tablet (5 mg total) by mouth every 4 (four) hours as needed (pain scale 4-7). 03/22/16   Ward, Elenora Fender, MD   witch hazel-glycerin (TUCKS) pad Apply 1 application topically as needed for hemorrhoids. 03/22/16   Ward, Elenora Fender, MD     Prenatal care site: [facility]  Social History: She  reports that she has never smoked. She has never used smokeless tobacco. She reports that she does not drink alcohol or use drugs.  Family History: family history includes Allergies in her paternal grandfather; Anemia in her mother; Asthma in her brother; Colon cancer in her maternal grandmother; Diabetes in her maternal grandfather; Hypertension in her father, maternal aunt, maternal grandfather, maternal uncle, and mother; Lupus in her father and maternal uncle.   Review of Systems: A full review of systems was performed and negative except as noted in the HPI.    Physical Exam:  Vital Signs: BP 116/71   Pulse 83   LMP 02/12/2017   General:   alert, cooperative, appears stated age and no distress  Skin:  normal and no rash or abnormalities  Neurologic:    Alert & oriented x 3  Lungs:   clear to auscultation bilaterally  Heart:   regular rate and rhythm, S1, S2 normal, no murmur, click, rub or gallop  Abdomen:  soft, non-tender; bowel sounds normal; no masses,  no organomegaly  Pelvis:  External genitalia: normal general appearance  FHT:  135 BPM  Presentations: cephalic  Cervix:    Dilation: 3cm   Effacement: 50%  Station:  -3   Consistency: medium   Position: posterior  Extremities: : non-tender, symmetric, no edema bilaterally.      Results for orders placed or performed during the hospital encounter of 11/16/17 (from the past 24 hour(s))  Urinalysis, Complete w Microscopic     Status: Abnormal   Collection Time: 11/17/17 12:28 AM  Result Value Ref Range   Color, Urine YELLOW (A) YELLOW   APPearance CLEAR (A) CLEAR   Specific Gravity, Urine 1.023 1.005 - 1.030   pH 7.0 5.0 - 8.0   Glucose, UA NEGATIVE NEGATIVE mg/dL   Hgb urine dipstick NEGATIVE NEGATIVE   Bilirubin  Urine NEGATIVE NEGATIVE   Ketones, ur 20 (A) NEGATIVE mg/dL   Protein, ur NEGATIVE NEGATIVE mg/dL   Nitrite NEGATIVE NEGATIVE   Leukocytes, UA TRACE (A) NEGATIVE   RBC / HPF 0-5 0 - 5 RBC/hpf   WBC, UA 0-5 0 - 5 WBC/hpf   Bacteria, UA RARE (A) NONE SEEN   Squamous Epithelial / LPF 0-5 0 - 5   Mucus PRESENT   Type and screen [facility]     Status: None   Collection Time: 11/17/17  3:29 AM  Result Value Ref Range   ABO/RH(D) B POS    Antibody Screen NEG    Sample Expiration      11/20/2017 Performed at [facility], [address] [place], [place] [address]   CBC     Status: Abnormal   Collection Time: 11/17/17  3:29 AM  Result Value Ref Range   WBC 5.8 4.0 - 10.5 K/uL   RBC 3.59 (L) 3.87 - 5.11 MIL/uL   Hemoglobin 9.9 (L) 12.0 - 15.0 g/dL   HCT 60.4 (L) 54.0 - 98.1 %   MCV 89.1 80.0 - 100.0 fL   MCH 27.6 26.0 - 34.0 pg   MCHC 30.9 30.0 - 36.0 g/dL   RDW 19.1 47.8 - 29.5 %   Platelets 95 (L) 150 - 400 K/uL   nRBC 0.0 0.0 - 0.2 %    Pertinent Results:  Prenatal Labs: Blood type/Rh B+  Antibody screen neg  Rubella Immune  Varicella Immune  RPR NR  HBsAg Neg  HIV NR  GC neg  Chlamydia neg  Genetic screening declined  1 hour GTT 95  3 hour GTT n/a  GBS negative   FHT: FHR: 130 bpm, variability: moderate,  accelerations:  Present,  decelerations:  Absent Category/reactivity:  Category I TOCO: irregular, every 6-12 minutes when tracing  Assessment:  Sheri Lee is a 22 y.o. G26P1001 female at [redacted]w[redacted]d with early labor, history of cesarean devliery.   Plan:  1. Admit to Labor & Delivery; consents reviewed and obtained  2. Fetal Well being  - Fetal Tracing: category I - GBS negative - Presentation: cephalic confirmed by vaginal exam   3. Routine OB: - Prenatal labs reviewed, as above - Rh positive - CBC & T&S on admit - NPO, IVF  4. Thrombocytopenia: - Platelet count earlier this morning 95k, repeat level ordered -  Platelets ordered through the blood bank to have available if needed  5. History of cesarean delivery, desires repeat: - Contractions monitored with external toco in place - Plan for continuous fetal monitoring  - Plan for repeat cesarean  - Regional versus general anesthesia contingent on repeat platelet level and anesthesia   Genia Del, CNM 11/17/2017 7:30 AM ----- Genia Del Certified Nurse Midwife [facility], Department of OB/GYN [facility]

## 2017-11-17 NOTE — Lactation Note (Signed)
This note was copied from a baby's chart. Lactation Consultation Note  Patient Name: Sheri Lee Date: 11/17/2017 Reason for consult: Initial assessment   Maternal Data Has patient been taught Hand Expression?: Yes Does the patient have breastfeeding experience prior to this delivery?: Yes  Feeding    LATCH Score Latch: Too sleepy or reluctant, no latch achieved, no sucking elicited.  Audible Swallowing: None  Type of Nipple: Everted at rest and after stimulation  Comfort (Breast/Nipple): Soft / non-tender  Hold (Positioning): Assistance needed to correctly position infant at breast and maintain latch.  LATCH Score: 5  Interventions Interventions: Skin to skin  Lactation Tools Discussed/Used     Consult Status Consult Status: Follow-up LC spoke with parents about the importance of skin to skin. LC also remembers assisting pt with first baby who is now 1yo. Mom states she breastfeed baby for 6 mos w/o any issues. LC discussed clusterfeeding, physiology of establishing breastmilk and transitional stool. LC spoke with dyad's MBU RN and said that if baby still hasn't eaten by 6hrs to have mom hand express or pump (hand expression is preferred) and syringe/cup/slow flow nipple bottle feed baby whatever he will take. As long as baby isn't symptomatic for low bld sgr, skin to skin should resume and offering breast if baby shows hunger cues. Since baby was c/s baby may have swallow amniotic fluid and it could be keeping his stomach full. However, since baby is late preterm we should monitor feedings closely.    Burnadette Peter 11/17/2017, 4:20 PM

## 2017-11-17 NOTE — Op Note (Signed)
Cesarean Section Procedure Note  11/17/2017  Patient:  Sheri Lee  22 y.o. female at [redacted]w[redacted]d.  Patient's last menstrual period was 02/12/2017. Preoperative diagnosis:  previous c-section in preterm labor, thrombocytopenia Postoperative diagnosis:  Same, live born female  PROCEDURE:  Procedure(s): CESAREAN SECTION (N/A) BILATERAL TUBAL LIGATION VIA P[place] METHOD Surgeon:  Surgeon(s) and Role:    * Ward, Elenora Fender, MD - Primary Anesthesia:  spinal I/O: Total I/O In: 1355 [I.V.:1350; IV Piggyback:5] Out: 850 [Urine:300; Blood:550] Specimens:  placenta, portion of right tube, portion of left tube Complications: None Apparent Disposition:  VS stable to PACU  Findings: normal uterus, tubes and ovaries bilaterally Live born female  Birth Weight: 5 lb 14.2 oz (2670 g) APGAR: 9, 9  Newborn Delivery   Birth date/time:  11/17/2017 11:44:00 Delivery type:  C-Section, Low Transverse Trial of labor:  No C-section categorization:  Repeat      Indication for procedure: 22 y.o. female at [redacted]w[redacted]d with scheduled repeat cesarean with bilateral tubal ligation at 37+0 for thrombocytopenia presented to labor and delivery with preterm labor.  In spite of efforts to stall labor, it progressed, and we proceeded with repeat cesarean.  She had previously requested bilateral tubal ligation for permanent sterilization, and due to emergent situation requiring sooner-than-scheduled surgery, we proceeded with tubal ligation.  Procedure Details   The risks, benefits, complications, treatment options, and expected outcomes were discussed with the patient. Informed consent was obtained. The patient was taken to Operating Room, identified as Sheri Lee and the procedure verified as a cesarean delivery.   After administration of anesthesia, the patient was prepped and draped in the usual sterile manner, including a vaginal prep. A surgical time out was performed, with the pediatric team present. After  confirming adequate anesthesia, a Pfannenstiel incision was made and carried down through the subcutaneous tissue to the fascia. Fascial incision was made and extended transversely. The fascia was separated from the underlying rectus tissue superiorly and inferiorly. The rectus muscles were divided in the midline. The peritoneum was identified and entered. Peritoneal incision was extended longitudinally.  A low transverse uterine incision was made. Delivered from cephalic presentation was a live born female. Delayed cord clamping was performed for 60 seconds, during which we sang Happy Birthday to [name]. The umbilical cord was doubly clamped and cut, and the baby was handed off to the awaitng pediatrician.  The placenta was removed intact and appeared normal. The uterus was delivered from the abdominal cavity and cleared of clots, membranes, and debris. The uterus, tubes and ovaries appeared normal. The uterine incision was closed with running locking sutures of 0 Vicryl, and then a second, imbricating stitch was placed. Hemostasis was observed.   The attention was turned to the bilateral fallopian tubes.  The tubes were traced to their fimbriated ends, and grasped at the middle of the isthmus.  The mesosalpinx was opened, and suture ligation was placed at the proximal and distal end of the tube.  The portion of tube between the suture was divided and handed to nursing as portion of left tube and portion of right tube.  These sites were hemostatic.  The abdominal cavity was evacuated of extraneous fluid. The uterus was returned to the abdominal cavity and again the incision was inspected for hemostasis, which was confirmed.  The paracolic gutters were cleared, and the tubal ligation sutures were observed to be intact.  The fascia was then reapproximated with running suture of vicryl. 80cc of Long- and short-acting bupivicaine was  injected circumferentially into the fascia.  After a change of gloves, the  subcutaneous tissue was irrigated and reapproximated with 3-0 vicryl in subdermal sutures. 20cc of long- and short-acting bupivacaine injected into the skin and subcutaneous tissues.  The incision was covered with surgical glue, and abdominal binder placed.     Instrument, sponge, and needle counts were correct prior the abdominal closure and at the conclusion of the case.   I was present and performed this procedure in its entirety.  VTE: SCDs Perioperative antibiotics: Ancef 2g  ----- Ranae Plumber, MD Attending Obstetrician and Gynecologist [facility], Department of OB/GYN [facility]

## 2017-11-17 NOTE — Anesthesia Preprocedure Evaluation (Addendum)
Anesthesia Evaluation  Patient identified by MRN, date of birth, ID band Patient awake    Reviewed: Allergy & Precautions, H&P , NPO status , Patient's Chart, lab work & pertinent test results  History of Anesthesia Complications Negative for: history of anesthetic complications  Airway Mallampati: III  TM Distance: >3 FB Neck ROM: full    Dental  (+) Chipped   Pulmonary neg pulmonary ROS, neg shortness of breath, asthma ,           Cardiovascular Exercise Tolerance: Good (-) hypertensionnegative cardio ROS       Neuro/Psych    GI/Hepatic negative GI ROS,   Endo/Other    Renal/GU   negative genitourinary   Musculoskeletal   Abdominal   Peds  Hematology  (+) Blood dyscrasia, ,   Anesthesia Other Findings Past Medical History: No date: Asthma No date: Seasonal allergies  Past Surgical History: 03/19/2016: CESAREAN SECTION; N/A     Comment:  Procedure: CESAREAN SECTION;  Surgeon: Elenora Fender Ward,               MD;  Location: [facility] ORS;  Service: Obstetrics;                Laterality: N/A; No date: KNEE SURGERY; Left No date: NO PAST SURGERIES  BMI    Body Mass Index:  29.58 kg/m      Reproductive/Obstetrics (+) Pregnancy                             Anesthesia Physical Anesthesia Plan  ASA: III  Anesthesia Plan: Spinal   Post-op Pain Management:    Induction:   PONV Risk Score and Plan:   Airway Management Planned: Natural Airway  Additional Equipment:   Intra-op Plan:   Post-operative Plan:   Informed Consent: I have reviewed the patients History and Physical, chart, labs and discussed the procedure including the risks, benefits and alternatives for the proposed anesthesia with the patient or authorized representative who has indicated his/her understanding and acceptance.   Dental Advisory Given  Plan Discussed with: Anesthesiologist, CRNA and  Surgeon  Anesthesia Plan Comments: (Patient reports no bleeding problems and no anticoagulant use. Patient has history of gestational thrombocytopenia, platelet count from this AM is 94 K.  Patient reports no coagulapathy history, no bleeding problems after previous C section, no easy bruising, no easy bleeding or slow to clot symptoms.  Patient informed that with her lower platlet count she could be at risk for epidural hematoma leading to paralysis.  She voiced understanding.  Patient offered GA, she declines GA and requests spinal over GA.  With her history I agree that the risks of spinal are outweighed by its benefits in a pregnant patient. This patient was discussed with Dr. Karlton Lemon who agreed with this plan.  Plan for spinal with backup GA  Patient consented for risks of anesthesia including but not limited to:  - adverse reactions to medications - risk of bleeding, infection, nerve damage and headache - risk of failed spinal - damage to teeth, lips or other oral mucosa - sore throat or hoarseness - Damage to heart, brain, lungs or loss of life  Patient voiced understanding.)       Anesthesia Quick Evaluation

## 2017-11-17 NOTE — Discharge Summary (Signed)
Obstetrical Discharge Summary  Patient Name: Sheri Lee DOB: [birth year] MRN: [id number]  Date of Admission: 11/16/2017 Date of Delivery: 11/17/17 Delivered by: Ranae Plumber, MD Date of Discharge: 11/19/17 Primary OB: [facility]  WUJ:[id number] last menstrual period was 02/12/2017. EDC Estimated Date of Delivery: 12/10/17 Gestational Age at Delivery: [redacted]w[redacted]d   Antepartum complications:  1. Previous cesarean 2. Thrombocytopenia  3. Anemia 4. History of asthma 5. Preterm labor  Admitting Diagnosis: preterm labor, prior cesarean delivery  Secondary Diagnosis: Patient Active Problem List   Diagnosis Date Noted  . Labor and delivery indication for care or intervention 11/17/2017  . Low back pain 11/16/2017  . Acute blood loss anemia 03/22/2016  . S/P cesarean section 03/22/2016  . Labor and delivery, indication for care 03/18/2016    Augmentation: n/a Complications: None Intrapartum complications/course: patient presented at 36+4 days with painful contractions, was observed and made cervical change to 3cm and diagnosed with preterm labor.  She declined tocolysis and elected to proceed with cesarean delivery today.  She had an uneventful cesarean delivery, confirmed request for tubal ligation.   Date of Delivery: 11/17/17 Delivered By: Leeroy Bock Ward Delivery Type: repeat cesarean section, low transverse incision double layer closure, with bilateral tubal ligation via Modified [place] method. Anesthesia: spinal Placenta: expressed Laceration: n/a Episiotomy: n/a Newborn Data: Live born female "M[place]" Birth Weight: 5 lb 14.2 oz (2670 g) APGAR: 9, 9  Newborn Delivery   Birth date/time:  11/17/2017 11:44:00 Delivery type:  C-Section, Low Transverse Trial of labor:  No C-section categorization:  Repeat     Postpartum Procedures: none  Post partum course: Patient had an uncomplicated postpartum course.  By time of discharge on POD#2, her pain was controlled  on oral pain medications; she had appropriate lochia and was ambulating, voiding without difficulty, tolerating regular diet and passing flatus.   She was deemed stable for discharge to home.    Discharge Physical Exam: 11/19/2017  BP 112/78 (BP Location: Left Arm)   Pulse 71   Temp 98.3 F (36.8 C) (Oral)   Resp 18   Ht 5\' 3"  (1.6 m)   Wt 75.8 kg   LMP 02/12/2017   SpO2 100%   BMI 29.58 kg/m   General: NAD CV: RRR Pulm: CTABL, nl effort ABD: s/nd/nt, fundus firm and below the umbilicus Lochia: moderate Incision: c/d/i DVT Evaluation: LE non-ttp, no evidence of DVT on exam.  Hemoglobin  Date Value Ref Range Status  11/18/2017 9.4 (L) 12.0 - 15.0 g/dL Final   HCT  Date Value Ref Range Status  11/18/2017 30.5 (L) 36.0 - 46.0 % Final     Disposition: stable, discharge to home. Baby Feeding: breastmilk Baby Disposition: home with mom  Rh Immune globulin given: n/a Rubella vaccine given: n/a Tdap vaccine given in AP or PP setting: AP Flu vaccine given in AP or PP setting: AP  Contraception: BTL  Prenatal Labs:   Blood type/Rh B+  Antibody screen neg  Rubella Immune  Varicella Immune  RPR NR  HBsAg Neg  HIV NR  GC neg  Chlamydia neg  Genetic screening declined  1 hour GTT 95  3 hour GTT n/a  GBS negative     Plan:  Sheri Lee was discharged to home in good condition. Follow-up appointment with Dr. Elesa Massed in 2 weeks.  Discharge Medications: Allergies as of 11/19/2017   No Active Allergies     Medication List    STOP taking these medications   acetaminophen 500 MG tablet Commonly known  as:  TYLENOL   dibucaine 1 % Oint Commonly known as:  NUPERCAINAL   ferrous sulfate 325 (65 FE) MG tablet   oxyCODONE 5 MG immediate release tablet Commonly known as:  Oxy IR/ROXICODONE   witch hazel-glycerin pad Commonly known as:  TUCKS     TAKE these medications   docusate sodium 100 MG capsule Commonly known as:  COLACE Take 1 capsule (100  mg total) by mouth daily as needed.   HYDROcodone-acetaminophen 5-325 MG tablet Commonly known as:  NORCO/VICODIN Take 1 tablet by mouth every 6 (six) hours as needed for moderate pain.   ibuprofen 600 MG tablet Commonly known as:  ADVIL,MOTRIN Take 1 tablet (600 mg total) by mouth every 6 (six) hours.   multivitamin-prenatal 27-0.8 MG Tabs tablet Take 1 tablet by mouth daily at 12 noon.       Follow-up Information    Ward, Elenora Fender, MD Follow up in 2 week(s).   Specialty:  Obstetrics and Gynecology Contact information: [address] [place] [address] [phone]           Signed: Ihor Austin Delshawn Stech MD Hit refresh and delete this line

## 2017-11-17 NOTE — Plan of Care (Signed)
Patient delivered female at 57 by cesarean section.

## 2017-11-17 NOTE — Anesthesia Post-op Follow-up Note (Signed)
Anesthesia QCDR form completed.        

## 2017-11-17 NOTE — Progress Notes (Signed)
Labor Progress Note  Sheri Lee is a 22 y.o. G2P1001 at [redacted]w[redacted]d by ultrasound admitted for low back pain/contractions.  Subjective:  Patient reports that she feels about the same and is feeling contractions every few minutes.   Objective: BP 116/71   Pulse 83   LMP 02/12/2017   Fetal Assessment: FHT:  FHR: 130 bpm, variability: moderate,  accelerations:  Present,  decelerations:  Absent Category/reactivity:  Category I UC:   Irregular, every 3-10 minutes when tracing SVE:    Dilation: 1cm  Effacement: 25%  Station:  -3  Consistency: medium  Position: posterior  Membrane status: intact Amniotic color: n/a  Labs: Lab Results  Component Value Date   WBC 5.8 11/17/2017   HGB 9.9 (L) 11/17/2017   HCT 32.0 (L) 11/17/2017   MCV 89.1 11/17/2017   PLT 95 (L) 11/17/2017    Assessment / Plan: Contractions  Labor: cervix with no significant change  Thrombocytopenia: Platelet count 95k from 100k on 11/14/17 Fetal Wellbeing:  Category I Anticipated MOD:  planned repeat LTCS   Discussed patient and history with Dr. Leeroy Bock Ward, who advises continued monitoring at this time.   Genia Del, CNM 11/17/2017, 4:17 AM

## 2017-11-18 ENCOUNTER — Encounter: Payer: Self-pay | Admitting: Obstetrics & Gynecology

## 2017-11-18 ENCOUNTER — Inpatient Hospital Stay: Admission: RE | Admit: 2017-11-18 | Payer: BLUE CROSS/BLUE SHIELD | Source: Ambulatory Visit

## 2017-11-18 LAB — RPR: RPR: NONREACTIVE

## 2017-11-18 LAB — CBC
HCT: 30.5 % — ABNORMAL LOW (ref 36.0–46.0)
Hemoglobin: 9.4 g/dL — ABNORMAL LOW (ref 12.0–15.0)
MCH: 27.4 pg (ref 26.0–34.0)
MCHC: 30.8 g/dL (ref 30.0–36.0)
MCV: 88.9 fL (ref 80.0–100.0)
Platelets: 86 10*3/uL — ABNORMAL LOW (ref 150–400)
RBC: 3.43 MIL/uL — ABNORMAL LOW (ref 3.87–5.11)
RDW: 12.6 % (ref 11.5–15.5)
WBC: 7.4 10*3/uL (ref 4.0–10.5)
nRBC: 0 % (ref 0.0–0.2)

## 2017-11-18 LAB — URINE CULTURE: Culture: <10000 — AB

## 2017-11-18 NOTE — Anesthesia Postprocedure Evaluation (Signed)
Anesthesia Post Note  Patient: Sheri Lee  Procedure(s) Performed: CESAREAN SECTION (N/A )  Patient location during evaluation: Mother Baby Anesthesia Type: Spinal Level of consciousness: oriented and awake and alert Pain management: pain level controlled Vital Signs Assessment: post-procedure vital signs reviewed and stable Respiratory status: spontaneous breathing, respiratory function stable and patient connected to nasal cannula oxygen Cardiovascular status: blood pressure returned to baseline and stable Postop Assessment: no headache, no backache and no apparent nausea or vomiting Anesthetic complications: no     Last Vitals:  Vitals:   11/18/17 0422 11/18/17 0801  BP: (!) 100/56 100/67  Pulse: 81 72  Resp: 18 18  Temp:  36.5 C  SpO2: 97% 98%    Last Pain:  Vitals:   11/18/17 0801  TempSrc: Axillary  PainSc:                  Rica Mast

## 2017-11-18 NOTE — Lactation Note (Signed)
This note was copied from a baby's chart. Lactation Consultation Note  Patient Name: Sheri Lee [id number] Date: 11/18/2017 Reason for consult: Follow-up assessment;Late-preterm 34-36.6wks Mom states baby won't latch to right breast, her first child did  Maternal Data Formula Feeding for Exclusion: No Does the patient have breastfeeding experience prior to this delivery?: Yes  Feeding Feeding Type: Breast Fed Attempted latching, baby sleepy at breast, sucks on tongue, few sucks obtained LATCH Score Latch: Repeated attempts needed to sustain latch, nipple held in mouth throughout feeding, stimulation needed to elicit sucking reflex.  Audible Swallowing: None  Type of Nipple: Everted at rest and after stimulation  Comfort (Breast/Nipple): Soft / non-tender  Hold (Positioning): Assistance needed to correctly position infant at breast and maintain latch.  LATCH Score: 6  Interventions Interventions: Hand express;Adjust position;Support pillows;Position options  Lactation Tools Discussed/Used WIC Program: Yes   Consult Status Consult Status: Follow-up Date: 11/18/17 Follow-up type: In-patient Encouraged mom to call to get assist to latch baby to right breast when baby showing cues  Dyann Kief 11/18/2017, 1:39 PM

## 2017-11-18 NOTE — Progress Notes (Signed)
Post Partum Day1/ POD#1 Subjective: no complaints, up ad lib, voiding and tolerating PO  Objective: Blood pressure 100/67, pulse 72, temperature 97.7 F (36.5 C), temperature source Axillary, resp. rate 18, height 5\' 3"  (1.6 m), weight 75.8 kg, last menstrual period 02/12/2017, SpO2 98 %, unknown if currently breastfeeding.  Physical Exam:  General: alert and cooperative  HEART;S1S2, RRR, No M/R/G LUNGS: CTA bilat, no W/R/R Lochia: mod, no clots Uterine Fundus: firm, U-1 Incision: healing well DVT Evaluation: No evidence of DVT seen on physical exam.  Recent Labs    11/17/17 0329 11/18/17 0525  HGB 9.9* 9.4*  HCT 32.0* 30.5*    Assessment/Plan: Breastfeeding  POD#1 stable Continue ordes OTC Fe   LOS: 1 day   Sharee Pimple 11/18/2017, 8:52 AM

## 2017-11-18 NOTE — Lactation Note (Signed)
This note was copied from a baby's chart. Lactation Consultation Note  Patient Name: Sheri Lee [id number] Date: 11/18/2017 Reason for consult: Follow-up assessment;Late-preterm 34-36.6wks Encouraged frequent feeds every 2h because low birth wt and late preterm, tires easily  Maternal Data Formula Feeding for Exclusion: No Does the patient have breastfeeding experience prior to this delivery?: Yes  Feeding Feeding Type: Breast Fed Fed 30 min on right breast mom had difficulty with this side, 1 min on left but baby was tired by this point and would not suck despite much stimulation LATCH Score Latch: Repeated attempts needed to sustain latch, nipple held in mouth throughout feeding, stimulation needed to elicit sucking reflex.(sluggish at first then rythmic sucking)  Audible Swallowing: Spontaneous and intermittent  Type of Nipple: Everted at rest and after stimulation  Comfort (Breast/Nipple): Soft / non-tender  Hold (Positioning): Assistance needed to correctly position infant at breast and maintain latch.  LATCH Score: 8  Interventions Interventions: Assisted with latch;Skin to skin;Hand express;Support pillows  Lactation Tools Discussed/Used WIC Program: Yes   Consult Status Consult Status: Follow-up Date: 11/19/17 Follow-up type: In-patient    Sheri Lee 11/18/2017, 3:51 PM

## 2017-11-19 ENCOUNTER — Encounter: Admission: RE | Payer: Self-pay | Source: Ambulatory Visit

## 2017-11-19 ENCOUNTER — Inpatient Hospital Stay
Admission: RE | Admit: 2017-11-19 | Payer: BLUE CROSS/BLUE SHIELD | Source: Ambulatory Visit | Admitting: Obstetrics & Gynecology

## 2017-11-19 LAB — BPAM PLATELET PHERESIS
BLOOD PRODUCT EXPIRATION DATE: 201911112359
UNIT TYPE AND RH: 6200

## 2017-11-19 LAB — PREPARE PLATELET PHERESIS: UNIT DIVISION: 0

## 2017-11-19 LAB — SURGICAL PATHOLOGY

## 2017-11-19 SURGERY — Surgical Case
Anesthesia: Spinal

## 2017-11-19 MED ORDER — IBUPROFEN 600 MG PO TABS: 600.0000 mg | ORAL_TABLET | Freq: Four times a day (QID) | ORAL | 0 refills | Status: DC

## 2017-11-19 MED ORDER — DOCUSATE SODIUM 100 MG PO CAPS: 100.0000 mg | ORAL_CAPSULE | Freq: Every day | ORAL | 2 refills | Status: AC | PRN

## 2017-11-19 MED ORDER — HYDROCODONE-ACETAMINOPHEN 5-325 MG PO TABS: 1.0000 | ORAL_TABLET | Freq: Four times a day (QID) | ORAL | 0 refills | Status: DC | PRN

## 2017-12-02 ENCOUNTER — Other Ambulatory Visit: Payer: BLUE CROSS/BLUE SHIELD

## 2018-03-04 ENCOUNTER — Encounter (HOSPITAL_COMMUNITY): Payer: Self-pay

## 2019-01-27 ENCOUNTER — Emergency Department
Admission: EM | Admit: 2019-01-27 | Discharge: 2019-01-27 | Disposition: A | Discharge status: Home / Self Care | Payer: BC Managed Care – PPO | Attending: Emergency Medicine | Admitting: Emergency Medicine

## 2019-01-27 ENCOUNTER — Other Ambulatory Visit: Payer: Self-pay

## 2019-01-27 ENCOUNTER — Encounter: Payer: Self-pay | Admitting: Emergency Medicine

## 2019-01-27 DIAGNOSIS — K649 Unspecified hemorrhoids: Secondary | ICD-10-CM | POA: Insufficient documentation

## 2019-01-27 DIAGNOSIS — K6289 Other specified diseases of anus and rectum: Secondary | ICD-10-CM | POA: Diagnosis present

## 2019-01-27 DIAGNOSIS — J45909 Unspecified asthma, uncomplicated: Secondary | ICD-10-CM | POA: Diagnosis not present

## 2019-01-27 DIAGNOSIS — K645 Perianal venous thrombosis: Secondary | ICD-10-CM | POA: Diagnosis not present

## 2019-01-27 MED ORDER — LIDOCAINE HCL (PF) 1 % IJ SOLN
5.0000 mL | Freq: Once | INTRAMUSCULAR | Status: AC
  Administered 2019-01-27: 5 mL via INTRADERMAL
  Filled 2019-01-27: qty 5

## 2019-01-27 MED ORDER — CEPHALEXIN 500 MG PO CAPS: 500.0000 mg | ORAL_CAPSULE | Freq: Three times a day (TID) | ORAL | 0 refills | Status: DC

## 2019-01-27 MED ORDER — HYDROCODONE-ACETAMINOPHEN 5-325 MG PO TABS: 1.0000 | ORAL_TABLET | Freq: Four times a day (QID) | ORAL | 0 refills | Status: DC | PRN

## 2019-01-27 NOTE — ED Provider Notes (Signed)
[facility] Emergency Department Provider Note  ____________________________________________   First MD Initiated Contact with Patient 01/27/19 1025     (approximate)  I have reviewed the triage vital signs and the nursing notes.   HISTORY  Chief Complaint Hemorrhoids    HPI Sheri Lee is a 24 y.o. female presents emergency department complaint of rectal pain and hemorrhoids.  History of same.  Tried over-the-counter cream without relief.  No bleeding.  States last time he had to cut them open.  No fever or chills.    Past Medical History:  Diagnosis Date  . Asthma   . Seasonal allergies     Patient Active Problem List   Diagnosis Date Noted  . Labor and delivery indication for care or intervention 11/17/2017  . Low back pain 11/16/2017  . Acute blood loss anemia 03/22/2016  . S/P cesarean section 03/22/2016  . Labor and delivery, indication for care 03/18/2016    Past Surgical History:  Procedure Laterality Date  . CESAREAN SECTION N/A 03/19/2016   Procedure: CESAREAN SECTION;  Surgeon: Honor Loh Ward, MD;  Location: [facility] ORS;  Service: Obstetrics;  Laterality: N/A;  . CESAREAN SECTION N/A 11/17/2017   Procedure: CESAREAN SECTION;  Surgeon: Ward, Honor Loh, MD;  Location: [facility] ORS;  Service: Obstetrics;  Laterality: N/A;  . KNEE SURGERY Left   . NO PAST SURGERIES      Prior to Admission medications   Medication Sig Start Date End Date Taking? Authorizing Provider  cephALEXin (KEFLEX) 500 MG capsule Take 1 capsule (500 mg total) by mouth 3 (three) times daily. 01/27/19   Jacksen Isip, Linden Dolin, PA-C  HYDROcodone-acetaminophen (NORCO/VICODIN) 5-325 MG tablet Take 1 tablet by mouth every 6 (six) hours as needed for moderate pain. 01/27/19   Versie Starks, PA-C    Allergies Patient has no known allergies.  Family History  Problem Relation Age of Onset  . Anemia Mother   . Hypertension Mother   . Lupus Father   . Hypertension Father     . Asthma Brother   . Colon cancer Maternal Grandmother   . Diabetes Maternal Grandfather   . Hypertension Maternal Grandfather   . Allergies Paternal Grandfather   . Lupus Maternal Uncle   . Hypertension Maternal Uncle   . Hypertension Maternal Aunt     Social History Social History   Tobacco Use  . Smoking status: Never Smoker  . Smokeless tobacco: Never Used  Substance Use Topics  . Alcohol use: No  . Drug use: No    Review of Systems  Constitutional: No fever/chills Eyes: No visual changes. ENT: No sore throat. Respiratory: Denies cough Gastrointestinal: Denies abdominal pain, positive for rectal pain and he reports Genitourinary: Negative for dysuria. Musculoskeletal: Negative for back pain. Skin: Negative for rash. Psychiatric: no mood changes,     ____________________________________________   PHYSICAL EXAM:  VITAL SIGNS: ED Triage Vitals  Enc Vitals Group     BP 01/27/19 0927 111/68     Pulse Rate 01/27/19 0927 83     Resp 01/27/19 0927 16     Temp 01/27/19 0927 97.8 F (36.6 C)     Temp Source 01/27/19 0927 Oral     SpO2 01/27/19 0927 100 %     Weight 01/27/19 0928 150 lb (68 kg)     Height 01/27/19 0928 5\' 3"  (1.6 m)     Head Circumference --      Peak Flow --      Pain Score  01/27/19 0928 9     Pain Loc --      Pain Edu? --      Excl. in GC? --     Constitutional: Alert and oriented. Well appearing and in no acute distress. Eyes: Conjunctivae are normal.  Head: Atraumatic. Nose: No congestion/rhinnorhea. Mouth/Throat: Mucous membranes are moist.   Neck:  supple no lymphadenopathy noted Cardiovascular: Normal rate, regular rhythm. Respiratory: Normal respiratory effort.  No retractions,  GU: deferred Rectal: Swollen hemorrhoid noted, questionable thrombosis Musculoskeletal: FROM all extremities, warm and well perfused Neurologic:  Normal speech and language.  Skin:  Skin is warm, dry and intact. No rash noted. Psychiatric: Mood and  affect are normal. Speech and behavior are normal.  ____________________________________________   LABS (all labs ordered are listed, but only abnormal results are displayed)  Labs Reviewed - No data to display ____________________________________________   ____________________________________________  RADIOLOGY    ____________________________________________   PROCEDURES  Procedure(s) performed: Patient gave consent verbally, area was cleaned with Betadine, 1% Xylocaine local, incision made at the hemorrhoid, blood was expelled.  Patient tolerated procedure well.    Procedures    ____________________________________________   INITIAL IMPRESSION / ASSESSMENT AND PLAN / ED COURSE  Pertinent labs & imaging results that were available during my care of the patient were reviewed by me and considered in my medical decision making (see chart for details).   Patient is a 24 year old female presents emergency department for rectal hemorrhoids.  See HPI  Physical exam shows swollen hemorrhoid.  See procedure note for incision and drainage.  Patient was given a prescription for Keflex and pain medication.  Was also given a work note.  She was discharged stable condition.    Sheri Lee was evaluated in Emergency Department on 01/27/2019 for the symptoms described in the history of present illness. She was evaluated in the context of the global COVID-19 pandemic, which necessitated consideration that the patient might be at risk for infection with the SARS-CoV-2 virus that causes COVID-19. Institutional protocols and algorithms that pertain to the evaluation of patients at risk for COVID-19 are in a state of rapid change based on information released by regulatory bodies including the CDC and federal and state organizations. These policies and algorithms were followed during the patient's care in the ED.   As part of my medical decision making, I reviewed the following data  within the electronic MEDICAL RECORD NUMBER Nursing notes reviewed and incorporated, Old chart reviewed, Notes from prior ED visits and [place] Controlled Substance Database  ____________________________________________   FINAL CLINICAL IMPRESSION(S) / ED DIAGNOSES  Final diagnoses:  Hemorrhoid thrombosis      NEW MEDICATIONS STARTED DURING THIS VISIT:  Discharge Medication List as of 01/27/2019 10:41 AM    START taking these medications   Details  cephALEXin (KEFLEX) 500 MG capsule Take 1 capsule (500 mg total) by mouth 3 (three) times daily., Starting Tue 01/27/2019, Normal    HYDROcodone-acetaminophen (NORCO/VICODIN) 5-325 MG tablet Take 1 tablet by mouth every 6 (six) hours as needed for moderate pain., Starting Tue 01/27/2019, Normal         Note:  This document was prepared using Dragon voice recognition software and may include unintentional dictation errors.    Faythe Ghee, PA-C 01/27/19 1241    Concha Se, MD 01/28/19 [phone]

## 2019-01-27 NOTE — ED Triage Notes (Signed)
Pt here with rectal pain from hemorrhoids, hx of the same, has tried OTC creams with no relief. Denies any bleeding from area. Denies constipation. NAD.

## 2019-01-27 NOTE — Discharge Instructions (Signed)
Follow-up with one of the surgeons listed on your discharge papers.  Please call for an appointment.  Soak in a warm sitz bath at least 3 times daily for the next 2 days.  Return to the emergency department if worsening.

## 2019-02-02 ENCOUNTER — Other Ambulatory Visit: Payer: Self-pay

## 2019-02-02 ENCOUNTER — Encounter: Payer: Self-pay | Admitting: Surgery

## 2019-02-02 ENCOUNTER — Ambulatory Visit (INDEPENDENT_AMBULATORY_CARE_PROVIDER_SITE_OTHER): Payer: BC Managed Care – PPO | Admitting: Surgery

## 2019-02-02 VITALS — BP 102/67 | HR 82 | Temp 97.4°F | Resp 14 | Ht 63.0 in | Wt 164.5 lb

## 2019-02-02 DIAGNOSIS — K645 Perianal venous thrombosis: Secondary | ICD-10-CM

## 2019-02-02 NOTE — Patient Instructions (Addendum)
Our surgery scheduler will call you to schedule your surgery. Please have the blue surgery sheet available when speaking with her. Please use a Fleets enema the night prior to and the morning or your surgery. [facility] or a drug store.  Fiber Content in Foods  See the following list for the dietary fiber content of some common foods. High-fiber foods High-fiber foods contain 4 grams or more (4g or more) of fiber per serving. They include:  Artichoke (fresh) -- 1 medium has 10.3g of fiber.  Baked beans, plain or vegetarian (canned) --  cup has 5.2g of fiber.  Blackberries or raspberries (fresh) --  cup has 4g of fiber.  Bran cereal --  cup has 8.6g of fiber.  Bulgur (cooked) --  cup has 4g of fiber.  Kidney beans (canned) --  cup has 6.8g of fiber.  Lentils (cooked) --  cup has 7.8g of fiber.  Pear (fresh) -- 1 medium has 5.1g of fiber.  Peas (frozen) --  cup has 4.4g of fiber.  Pinto beans (canned) --  cup has 5.5g of fiber.  Pinto beans (dried and cooked) --  cup has 7.7g of fiber.  Potato with skin (baked) -- 1 medium has 4.4g of fiber.  Quinoa (cooked) --  cup has 5g of fiber.  Soybeans (canned, frozen, or fresh) --  cup has 5.1g of fiber. Moderate-fiber foods Moderate-fiber foods contain 1-4 grams (1-4g) of fiber per serving. They include:  Almonds -- 1 oz. has 3.5g of fiber.  Apple with skin -- 1 medium has 3.3g of fiber.  Applesauce, sweetened --  cup has 1.5g of fiber.  Bagel, plain -- one 4-inch (10-cm) bagel has 2g of fiber.  Banana -- 1 medium has 3.1g of fiber.  Broccoli (cooked) --  cup has 2.5g of fiber.  Carrots (cooked) --  cup has 2.3g of fiber.  Corn (canned or frozen) --  cup has 2.1g of fiber.  Corn tortilla -- one 6-inch (15-cm) tortilla has 1.5g of fiber.  Green beans (canned) --  cup has 2g of fiber.  Instant oatmeal --  cup has about 2g of fiber.  Long-grain brown rice (cooked) -- 1 cup has  3.5g of fiber.  Macaroni, enriched (cooked) -- 1 cup has 2.5g of fiber.  Melon -- 1 cup has 1.4g of fiber.  Multigrain cereal --  cup has about 2-4g of fiber.  Orange -- 1 small has 3.1g of fiber.  Potatoes, mashed --  cup has 1.6g of fiber.  Raisins -- 1/4 cup has 1.6g of fiber.  Squash --  cup has 2.9g of fiber.  Sunflower seeds --  cup has 1.1g of fiber.  Tomato -- 1 medium has 1.5g of fiber.  Vegetable or soy patty -- 1 has 3.4g of fiber.  Whole-wheat bread -- 1 slice has 2g of fiber.  Whole-wheat spaghetti --  cup has 3.2g of fiber. Low-fiber foods Low-fiber foods contain less than 1 gram (less than 1g) of fiber per serving. They include:  Egg -- 1 large.  Flour tortilla -- one 6-inch (15-cm) tortilla.  Fruit juice --  cup.  Lettuce -- 1 cup.  Meat, poultry, or fish -- 1 oz.  Milk -- 1 cup.  Spinach (raw) -- 1 cup.  White bread -- 1 slice.  White rice --  cup.  Yogurt --  cup. Actual amounts of fiber in foods may be different depending on processing. Talk with your dietitian about how much fiber you  need in your diet. This information is not intended to replace advice given to you by your health care provider. Make sure you discuss any questions you have with your health care provider. Document Revised: 08/18/2015 Document Reviewed: 02/17/2015 Elsevier Patient Education  2020 [employer].  How to Take a [employer] A sitz bath is a warm water bath that may be used to care for your rectum, genital area, or the area between your rectum and genitals (perineum). For a sitz bath, the water only comes up to your hips and covers your buttocks. A sitz bath may done at home in a bathtub or with a portable sitz bath that fits over the toilet. Your health care provider may recommend a sitz bath to help:  Relieve pain and discomfort after delivering a baby.  Relieve pain and itching from hemorrhoids or anal fissures.  Relieve pain after certain  surgeries.  Relax muscles that are sore or tight. How to take a sitz bath Take 3-4 sitz baths a day, or as many as told by your health care provider. Bathtub sitz bath To take a sitz bath in a bathtub: 1. Partially fill a bathtub with warm water. The water should be deep enough to cover your hips and buttocks when you are sitting in the tub. 2. If your health care provider told you to put medicine in the water, follow his or her instructions. 3. Sit in the water. 4. Open the tub drain a little, and leave it open during your bath. 5. Turn on the warm water again, enough to replace the water that is draining out. Keep the water running throughout your bath. This helps keep the water at the right level and the right temperature. 6. Soak in the water for 15-20 minutes, or as long as told by your health care provider. 7. When you are done, be careful when you stand up. You may feel dizzy. 8. After the sitz bath, pat yourself dry. Do not rub your skin to dry it.  Over-the-toilet sitz bath To take a sitz bath with an over-the-toilet basin: 1. Follow the manufacturer's instructions. 2. Fill the basin with warm water. 3. If your health care provider told you to put medicine in the water, follow his or her instructions. 4. Sit on the seat. Make sure the water covers your buttocks and perineum. 5. Soak in the water for 15-20 minutes, or as long as told by your health care provider. 6. After the sitz bath, pat yourself dry. Do not rub your skin to dry it. 7. Clean and dry the basin between uses. 8. Discard the basin if it cracks, or according to the manufacturer's instructions. Contact a health care provider if:  Your symptoms get worse. Do not continue with sitz baths if your symptoms get worse.  You have new symptoms. If this happens, do not continue with sitz baths until you talk with your health care provider. Summary  A sitz bath is a warm water bath in which the water only comes up to your  hips and covers your buttocks.  A sitz bath may help relieve itching, relieve pain, and relax muscles that are sore or tight in the lower part of your body, including your genital area.  Take 3-4 sitz baths a day, or as many as told by your health care provider. Soak in the water for 15-20 minutes.  Do not continue with sitz baths if your symptoms get worse. This information is not intended to replace  advice given to you by your health care provider. Make sure you discuss any questions you have with your health care provider. Document Revised: 05/26/2018 Document Reviewed: 12/27/2016 Elsevier Patient Education  2020 Elsevier Inc. Surgical Procedures for Hemorrhoids, Care After This sheet gives you information about how to care for yourself after your procedure. Your health care provider may also give you more specific instructions. If you have problems or questions, contact your health care provider. What can I expect after the procedure? After the procedure, it is common to have:  Rectal pain.  Pain when you are having a bowel movement.  Slight rectal bleeding. This is more likely to happen with the first bowel movement after surgery. Follow these instructions at home: Medicines  Take over-the-counter and prescription medicines only as told by your health care provider.  If you were prescribed an antibiotic medicine, use it as told by your health care provider. Do not stop using the antibiotic even if your condition improves.  Ask your health care provider if the medicine prescribed to you requires you to avoid driving or using heavy machinery.  Use a stool softener or a bulk laxative as told by your health care provider. Eating and drinking  Follow instructions from your health care provider about what to eat or drink after your procedure.  You may need to take actions to prevent or treat constipation, such as: ? Drink enough fluid to keep your urine pale yellow. ? Take  over-the-counter or prescription medicines. ? Eat foods that are high in fiber, such as beans, whole grains, and fresh fruits and vegetables. ? Limit foods that are high in fat and processed sugars, such as fried or sweet foods. Activity   Rest as told by your health care provider.  Avoid sitting for a long time without moving. Get up to take short walks every 1-2 hours. This is important to improve blood flow and breathing. Ask for help if you feel weak or unsteady.  Return to your normal activities as told by your health care provider. Ask your health care provider what activities are safe for you.  Do not lift anything that is heavier than 10 lb (4.5 kg), or the limit that you are told, until your health care provider says that it is safe.  Do not strain to have a bowel movement.  Do not spend a long time sitting on the toilet. General instructions   Take warm sitz baths for 15-20 minutes, 2-3 times a day to relieve soreness or itching and to keep the rectal area clean.  Apply ice packs to the area to reduce swelling and pain.  Do not drive for 24 hours if you were given a sedative during your procedure.  Keep all follow-up visits as told by your health care provider. This is important. Contact a health care provider if:  Your pain medicine is not helping.  You have a fever or chills.  You have bad smelling drainage.  You have a lot of swelling.  You become constipated.  You have trouble passing urine. Get help right away if:  You have very bad rectal pain.  You have heavy bleeding from your rectum. Summary  After the procedure, it is common to have pain and slight rectal bleeding.  Take warm sitz baths for 15-20 minutes, 2-3 times a day to relieve soreness or itching and to keep the rectal area clean.  Avoid straining when having a bowel movement.  Eat foods that are high in  fiber, such as beans, whole grains, and fresh fruits and vegetables.  Take  over-the-counter and prescription medicines only as told by your health care provider. This information is not intended to replace advice given to you by your health care provider. Make sure you discuss any questions you have with your health care provider. Document Revised: 06/11/2018 Document Reviewed: 11/12/2017 Elsevier Patient Education  2020 [employer].   Hemorrhoids Hemorrhoids are swollen veins that may develop:  In the butt (rectum). These are called internal hemorrhoids.  Around the opening of the butt (anus). These are called external hemorrhoids. Hemorrhoids can cause pain, itching, or bleeding. Most of the time, they do not cause serious problems. They usually get better with diet changes, lifestyle changes, and other home treatments. What are the causes? This condition may be caused by:  Having trouble pooping (constipation).  Pushing hard (straining) to poop.  Watery poop (diarrhea).  Pregnancy.  Being very overweight (obese).  Sitting for long periods of time.  Heavy lifting or other activity that causes you to strain.  Anal sex.  Riding a bike for a long period of time. What are the signs or symptoms? Symptoms of this condition include:  Pain.  Itching or soreness in the butt.  Bleeding from the butt.  Leaking poop.  Swelling in the area.  One or more lumps around the opening of your butt. How is this diagnosed? A doctor can often diagnose this condition by looking at the affected area. The doctor may also:  Do an exam that involves feeling the area with a gloved hand (digital rectal exam).  Examine the area inside your butt using a small tube (anoscope).  Order blood tests. This may be done if you have lost a lot of blood.  Have you get a test that involves looking inside the colon using a flexible tube with a camera on the end (sigmoidoscopy or colonoscopy). How is this treated? This condition can usually be treated at home. Your doctor  may tell you to change what you eat, make lifestyle changes, or try home treatments. If these do not help, procedures can be done to remove the hemorrhoids or make them smaller. These may involve:  Placing rubber bands at the base of the hemorrhoids to cut off their blood supply.  Injecting medicine into the hemorrhoids to shrink them.  Shining a type of light energy onto the hemorrhoids to cause them to fall off.  Doing surgery to remove the hemorrhoids or cut off their blood supply. Follow these instructions at home: Eating and drinking   Eat foods that have a lot of fiber in them. These include whole grains, beans, nuts, fruits, and vegetables.  Ask your doctor about taking products that have added fiber (fibersupplements).  Reduce the amount of fat in your diet. You can do this by: ? Eating low-fat dairy products. ? Eating less red meat. ? Avoiding processed foods.  Drink enough fluid to keep your pee (urine) pale yellow. Managing pain and swelling   Take a warm-water bath (sitz bath) for 20 minutes to ease pain. Do this 3-4 times a day. You may do this in a bathtub or using a portable sitz bath that fits over the toilet.  If told, put ice on the painful area. It may be helpful to use ice between your warm baths. ? Put ice in a plastic bag. ? Place a towel between your skin and the bag. ? Leave the ice on for 20 minutes, 2-3  times a day. General instructions  Take over-the-counter and prescription medicines only as told by your doctor. ? Medicated creams and medicines may be used as told.  Exercise often. Ask your doctor how much and what kind of exercise is best for you.  Go to the bathroom when you have the urge to poop. Do not wait.  Avoid pushing too hard when you poop.  Keep your butt dry and clean. Use wet toilet paper or moist towelettes after pooping.  Do not sit on the toilet for a long time.  Keep all follow-up visits as told by your doctor. This is  important. Contact a doctor if you:  Have pain and swelling that do not get better with treatment or medicine.  Have trouble pooping.  Cannot poop.  Have pain or swelling outside the area of the hemorrhoids. Get help right away if you have:  Bleeding that will not stop. Summary  Hemorrhoids are swollen veins in the butt or around the opening of the butt.  They can cause pain, itching, or bleeding.  Eat foods that have a lot of fiber in them. These include whole grains, beans, nuts, fruits, and vegetables.  Take a warm-water bath (sitz bath) for 20 minutes to ease pain. Do this 3-4 times a day. This information is not intended to replace advice given to you by your health care provider. Make sure you discuss any questions you have with your health care provider. Document Revised: 01/02/2018 Document Reviewed: 05/16/2017 Elsevier Patient Education  [place].

## 2019-02-02 NOTE — H&P (View-Only) (Signed)
02/02/2019  Reason for Visit:  Thrombosed external hemorrhoid  History of Present Illness: Sheri Lee is a 24 y.o. female presenting for evaluation of a thrombosed external hemorrhoid.  She presented to the ED on 01/27/19 with complaints of perianal pain and hemorrhoid issues.  She was found to have a swollen external hemorrhoid, possibly thrombosed.  She underwent I&D in the ED on the same day, removing clot.  She presents today for follow up.  She reports that now she's doing very well and no longer had any pain.  The swelling has significantly improved and feels softer.  She is doing Sitz baths and denies any constipation.    Of note, the patient reports she's had issues with hemorrhoids in the past, and she actually had a prior I&D in the same site in August 2019.  She had seen Dr. Lysle Pearl for it, but she was pregnant at the time and then the patient did not follow up after her pregnancy.  Past Medical History: Past Medical History:  Diagnosis Date  . Asthma   . Seasonal allergies      Past Surgical History: Past Surgical History:  Procedure Laterality Date  . CESAREAN SECTION N/A 03/19/2016   Procedure: CESAREAN SECTION;  Surgeon: Honor Loh Ward, MD;  Location: [facility] ORS;  Service: Obstetrics;  Laterality: N/A;  . CESAREAN SECTION N/A 11/17/2017   Procedure: CESAREAN SECTION;  Surgeon: Ward, Honor Loh, MD;  Location: [facility] ORS;  Service: Obstetrics;  Laterality: N/A;  . I&D hemorrhoids    . KNEE SURGERY Left   . NO PAST SURGERIES      Home Medications: Prior to Admission medications   Medication Sig Start Date End Date Taking? Authorizing Provider  cephALEXin (KEFLEX) 500 MG capsule Take 1 capsule (500 mg total) by mouth 3 (three) times daily. 01/27/19  Yes Fisher, Linden Dolin, PA-C    Allergies: No Known Allergies  Social History:  reports that she has never smoked. She has never used smokeless tobacco. She reports that she does not drink alcohol or use drugs.   Family  History: Family History  Problem Relation Age of Onset  . Anemia Mother   . Hypertension Mother   . Lupus Father   . Hypertension Father   . Asthma Brother   . Colon cancer Maternal Grandmother   . Diabetes Maternal Grandfather   . Hypertension Maternal Grandfather   . Allergies Paternal Grandfather   . Lupus Maternal Uncle   . Hypertension Maternal Uncle   . Hypertension Maternal Aunt     Review of Systems: Review of Systems  Constitutional: Negative for chills and fever.  HENT: Negative for hearing loss.   Eyes: Negative for blurred vision.  Respiratory: Negative for shortness of breath.   Cardiovascular: Negative for chest pain.  Gastrointestinal: Negative for abdominal pain, nausea and vomiting.  Genitourinary: Negative for dysuria.  Musculoskeletal: Negative for myalgias.  Skin: Negative for rash.  Neurological: Negative for dizziness.  Psychiatric/Behavioral: Negative for depression.    Physical Exam BP 102/67   Pulse 82   Temp (!) 97.4 F (36.3 C) (Temporal)   Resp 14   Ht 5\' 3"  (1.6 m)   Wt 164 lb 8 oz (74.6 kg)   LMP 01/20/2019 (Approximate)   SpO2 100%   BMI 29.14 kg/m  CONSTITUTIONAL: No acute distress. HEENT:  Normocephalic, atraumatic, extraocular motion intact. NECK: Trachea is midline, and there is no jugular venous distension.  RESPIRATORY:  Lungs are clear, and breath sounds are equal bilaterally. Normal  respiratory effort without pathologic use of accessory muscles. CARDIOVASCULAR: Heart is regular without murmurs, gallops, or rubs. GI: The abdomen is soft, non-distended, non-tender.  RECTAL:  External exam reveals enlarged external hemorrhoids.  Right posterior column is the largest, and the site of prior I&D.  The wound is healing well, without any induration or erythema.  There is no more thrombosis and the tissue is not edematous.  The right anterior column is also enlarged, but smaller than right posterior.  The left lateral column is minimally  enlarged only.  On digital rectal exam, I do not palpate any significantly enlarged internal component, but I am unable to do anoscopy at bedside.  No gross blood. MUSCULOSKELETAL:  Normal muscle strength and tone in all four extremities.  No peripheral edema or cyanosis. SKIN: Skin turgor is normal. There are no pathologic skin lesions.  NEUROLOGIC:  Motor and sensation is grossly normal.  Cranial nerves are grossly intact. PSYCH:  Alert and oriented to person, place and time. Affect is normal.  Laboratory Analysis: No results found for this or any previous visit (from the past 24 hour(s)).  Imaging: No results found.  Assessment and Plan: This is a 24 y.o. female s/p I&D of right posterior thrombosed hemorrhoid.    Discussed with the patient that we could proceed with watchful waiting and conservative management with Sitz baths and avoiding constipation and changing bowel habits.  However, given that this is her second episode in 1.5 years, she would rather proceed with excision.  Discussed with her the plan for an Exam under Anesthesia and Hemorrhoidectomy.  Discussed with her that if any of the internal components were also enlarged, we would proceed with excision of them as well.  I would only do two columns at the most in one setting to avoid risk of anal canal stenosis.  Discussed with her the risks of bleeding, infection, injury to surrounding structures, post-operative pain, time off from work, and she's willing to proceed.  Discussed with her that given our new COVID restrictions in the hospital, we would have to wait until 02/18/19 for surgery.  She's ok with this.  She also understands that she would need to be tested for COVID prior to surgery.  All of her questions have been answered.  Face-to-face time spent with the patient and care providers was 45 minutes, with more than 50% of the time spent counseling, educating, and coordinating care of the patient.     Howie Ill,  MD [facility]

## 2019-02-02 NOTE — Progress Notes (Signed)
02/02/2019  Reason for Visit:  Thrombosed external hemorrhoid  History of Present Illness: Sheri Lee is a 24 y.o. female presenting for evaluation of a thrombosed external hemorrhoid.  She presented to the ED on 01/27/19 with complaints of perianal pain and hemorrhoid issues.  She was found to have a swollen external hemorrhoid, possibly thrombosed.  She underwent I&D in the ED on the same day, removing clot.  She presents today for follow up.  She reports that now she's doing very well and no longer had any pain.  The swelling has significantly improved and feels softer.  She is doing Sitz baths and denies any constipation.    Of note, the patient reports she's had issues with hemorrhoids in the past, and she actually had a prior I&D in the same site in August 2019.  She had seen Dr. Sakai for it, but she was pregnant at the time and then the patient did not follow up after her pregnancy.  Past Medical History: Past Medical History:  Diagnosis Date  . Asthma   . Seasonal allergies      Past Surgical History: Past Surgical History:  Procedure Laterality Date  . CESAREAN SECTION N/A 03/19/2016   Procedure: CESAREAN SECTION;  Surgeon: Chelsea C Ward, MD;  Location: [facility] ORS;  Service: Obstetrics;  Laterality: N/A;  . CESAREAN SECTION N/A 11/17/2017   Procedure: CESAREAN SECTION;  Surgeon: Ward, Chelsea C, MD;  Location: [facility] ORS;  Service: Obstetrics;  Laterality: N/A;  . I&D hemorrhoids    . KNEE SURGERY Left   . NO PAST SURGERIES      Home Medications: Prior to Admission medications   Medication Sig Start Date End Date Taking? Authorizing Provider  cephALEXin (KEFLEX) 500 MG capsule Take 1 capsule (500 mg total) by mouth 3 (three) times daily. 01/27/19  Yes Fisher, Susan W, PA-C    Allergies: No Known Allergies  Social History:  reports that she has never smoked. She has never used smokeless tobacco. She reports that she does not drink alcohol or use drugs.   Family  History: Family History  Problem Relation Age of Onset  . Anemia Mother   . Hypertension Mother   . Lupus Father   . Hypertension Father   . Asthma Brother   . Colon cancer Maternal Grandmother   . Diabetes Maternal Grandfather   . Hypertension Maternal Grandfather   . Allergies Paternal Grandfather   . Lupus Maternal Uncle   . Hypertension Maternal Uncle   . Hypertension Maternal Aunt     Review of Systems: Review of Systems  Constitutional: Negative for chills and fever.  HENT: Negative for hearing loss.   Eyes: Negative for blurred vision.  Respiratory: Negative for shortness of breath.   Cardiovascular: Negative for chest pain.  Gastrointestinal: Negative for abdominal pain, nausea and vomiting.  Genitourinary: Negative for dysuria.  Musculoskeletal: Negative for myalgias.  Skin: Negative for rash.  Neurological: Negative for dizziness.  Psychiatric/Behavioral: Negative for depression.    Physical Exam BP 102/67   Pulse 82   Temp (!) 97.4 F (36.3 C) (Temporal)   Resp 14   Ht 5' 3" (1.6 m)   Wt 164 lb 8 oz (74.6 kg)   LMP 01/20/2019 (Approximate)   SpO2 100%   BMI 29.14 kg/m  CONSTITUTIONAL: No acute distress. HEENT:  Normocephalic, atraumatic, extraocular motion intact. NECK: Trachea is midline, and there is no jugular venous distension.  RESPIRATORY:  Lungs are clear, and breath sounds are equal bilaterally. Normal   respiratory effort without pathologic use of accessory muscles. CARDIOVASCULAR: Heart is regular without murmurs, gallops, or rubs. GI: The abdomen is soft, non-distended, non-tender.  RECTAL:  External exam reveals enlarged external hemorrhoids.  Right posterior column is the largest, and the site of prior I&D.  The wound is healing well, without any induration or erythema.  There is no more thrombosis and the tissue is not edematous.  The right anterior column is also enlarged, but smaller than right posterior.  The left lateral column is minimally  enlarged only.  On digital rectal exam, I do not palpate any significantly enlarged internal component, but I am unable to do anoscopy at bedside.  No gross blood. MUSCULOSKELETAL:  Normal muscle strength and tone in all four extremities.  No peripheral edema or cyanosis. SKIN: Skin turgor is normal. There are no pathologic skin lesions.  NEUROLOGIC:  Motor and sensation is grossly normal.  Cranial nerves are grossly intact. PSYCH:  Alert and oriented to person, place and time. Affect is normal.  Laboratory Analysis: No results found for this or any previous visit (from the past 24 hour(s)).  Imaging: No results found.  Assessment and Plan: This is a 24 y.o. female s/p I&D of right posterior thrombosed hemorrhoid.    Discussed with the patient that we could proceed with watchful waiting and conservative management with Sitz baths and avoiding constipation and changing bowel habits.  However, given that this is her second episode in 1.5 years, she would rather proceed with excision.  Discussed with her the plan for an Exam under Anesthesia and Hemorrhoidectomy.  Discussed with her that if any of the internal components were also enlarged, we would proceed with excision of them as well.  I would only do two columns at the most in one setting to avoid risk of anal canal stenosis.  Discussed with her the risks of bleeding, infection, injury to surrounding structures, post-operative pain, time off from work, and she's willing to proceed.  Discussed with her that given our new COVID restrictions in the hospital, we would have to wait until 02/18/19 for surgery.  She's ok with this.  She also understands that she would need to be tested for COVID prior to surgery.  All of her questions have been answered.  Face-to-face time spent with the patient and care providers was 45 minutes, with more than 50% of the time spent counseling, educating, and coordinating care of the patient.     Howie Ill,  MD [facility]

## 2019-02-03 ENCOUNTER — Telehealth: Payer: Self-pay | Admitting: Surgery

## 2019-02-03 NOTE — Telephone Encounter (Signed)
Pt has been advised of pre admission date/time, Covid Testing date and Surgery date.  Surgery Date: 02/18/19 Preadmission Testing Date: 02/12/19 between 8a-1p Covid Testing Date: 02/16/19 - patient advised to go to [facility] ([address] [facility])  Per patients request, a blue surgical packet has been mailed to the address on file.  Patient has been made aware to call [phone], between 1-3:00pm the day before surgery, to find out what time to arrive.

## 2019-02-12 ENCOUNTER — Encounter
Admission: RE | Admit: 2019-02-12 | Discharge: 2019-02-12 | Disposition: A | Discharge status: Home / Self Care | Payer: BC Managed Care – PPO | Source: Ambulatory Visit | Attending: Surgery | Admitting: Surgery

## 2019-02-12 ENCOUNTER — Other Ambulatory Visit: Payer: Self-pay

## 2019-02-12 NOTE — Patient Instructions (Signed)
Your procedure is scheduled on: Wednesday, February 10 Report to Day Surgery on the 2nd floor of the [facility] To find out your arrival time, please call [phone] between 1PM - 3PM on: Tuesday, February 9  REMEMBER: Instructions that are not followed completely may result in serious medical risk, up to and including death; or upon the discretion of your surgeon and anesthesiologist your surgery may need to be rescheduled.  Do not eat food after midnight the night before surgery.  No gum chewing, lozengers or hard candies.  You may however, drink CLEAR liquids up to 2 hours before you are scheduled to arrive for your surgery. Do not drink anything within 2 hours of the start of your surgery.  Clear liquids include: - water  - apple juice without pulp - gatorade - black coffee or tea (Do NOT add milk or creamers to the coffee or tea) Do NOT drink anything that is not on this list.  No Alcohol for 24 hours before or after surgery.  No Smoking including e-cigarettes for 24 hours prior to surgery.  No chewable tobacco products for at least 6 hours prior to surgery.  No nicotine patches on the day of surgery.  On the morning of surgery brush your teeth with toothpaste and water, you may rinse your mouth with mouthwash if you wish. Do not swallow any toothpaste or mouthwash.  Notify your doctor if there is any change in your medical condition (cold, fever, infection).  Do not wear jewelry, make-up, hairpins, clips or nail polish.  Do not wear lotions, powders, or perfumes.   Do not shave 48 hours prior to surgery.   Contacts and dentures may not be worn into surgery.  Do not bring valuables to the hospital, including drivers license, insurance or credit cards.  [facility] is not responsible for any belongings or valuables.   TAKE THESE MEDICATIONS THE MORNING OF SURGERY:  1.  Albuterol inhaler  Fleets enema the night before surgery (10 pm) and the morning of surgery  as directed.  Use inhalers on the day of surgery and bring to the hospital.  Stop Anti-inflammatories (NSAIDS) such as Advil, Aleve, Ibuprofen, Motrin, Naproxen, Naprosyn and Aspirin based products such as Excedrin, Goodys Powder, BC Powder. (May take Tylenol or Acetaminophen if needed.)  Stop ANY OVER THE COUNTER supplements until after surgery.  Wear comfortable clothing (specific to your surgery type) to the hospital.  Plan for stool softeners for home use.  If you are being discharged the day of surgery, you will not be allowed to drive home. You will need a responsible adult to drive you home and stay with you that night.   If you are taking public transportation, you will need to have a responsible adult with you. Please confirm with your physician that it is acceptable to use public transportation.   Please call [phone] if you have any questions about these instructions.

## 2019-02-16 ENCOUNTER — Other Ambulatory Visit
Admission: RE | Admit: 2019-02-16 | Discharge: 2019-02-16 | Disposition: A | Discharge status: Home / Self Care | Payer: BC Managed Care – PPO | Source: Ambulatory Visit | Attending: Surgery | Admitting: Surgery

## 2019-02-16 ENCOUNTER — Other Ambulatory Visit: Payer: Self-pay

## 2019-02-16 DIAGNOSIS — Z01812 Encounter for preprocedural laboratory examination: Secondary | ICD-10-CM | POA: Diagnosis not present

## 2019-02-16 DIAGNOSIS — Z20822 Contact with and (suspected) exposure to covid-19: Secondary | ICD-10-CM | POA: Diagnosis not present

## 2019-02-16 LAB — CBC
HCT: 38.5 % (ref 36.0–46.0)
Hemoglobin: 12 g/dL (ref 12.0–15.0)
MCH: 27.5 pg (ref 26.0–34.0)
MCHC: 31.2 g/dL (ref 30.0–36.0)
MCV: 88.3 fL (ref 80.0–100.0)
Platelets: 165 10*3/uL (ref 150–400)
RBC: 4.36 MIL/uL (ref 3.87–5.11)
RDW: 11.9 % (ref 11.5–15.5)
WBC: 6.2 10*3/uL (ref 4.0–10.5)
nRBC: 0 % (ref 0.0–0.2)

## 2019-02-17 LAB — SARS CORONAVIRUS 2 (TAT 6-24 HRS): SARS Coronavirus 2: NEGATIVE

## 2019-02-17 MED ORDER — CIPROFLOXACIN IN D5W 400 MG/200ML IV SOLN
400.0000 mg | INTRAVENOUS | Status: AC
  Administered 2019-02-18: 12:00:00 400 mg via INTRAVENOUS

## 2019-02-17 MED ORDER — METRONIDAZOLE IN NACL 5-0.79 MG/ML-% IV SOLN
500.0000 mg | INTRAVENOUS | Status: AC
  Administered 2019-02-18: 500 mg via INTRAVENOUS
  Filled 2019-02-17: qty 100

## 2019-02-18 ENCOUNTER — Ambulatory Visit: Payer: BC Managed Care – PPO | Admitting: Registered Nurse

## 2019-02-18 ENCOUNTER — Ambulatory Visit
Admission: RE | Admit: 2019-02-18 | Discharge: 2019-02-18 | Disposition: A | Discharge status: Home / Self Care | Payer: BC Managed Care – PPO | Attending: Surgery | Admitting: Surgery

## 2019-02-18 ENCOUNTER — Encounter
Admission: RE | Disposition: A | Discharge status: Home / Self Care | Payer: Self-pay | Source: Home / Self Care | Attending: Surgery

## 2019-02-18 ENCOUNTER — Other Ambulatory Visit: Payer: Self-pay

## 2019-02-18 ENCOUNTER — Encounter: Payer: Self-pay | Admitting: Surgery

## 2019-02-18 DIAGNOSIS — K640 First degree hemorrhoids: Secondary | ICD-10-CM | POA: Diagnosis not present

## 2019-02-18 DIAGNOSIS — Z8489 Family history of other specified conditions: Secondary | ICD-10-CM | POA: Insufficient documentation

## 2019-02-18 DIAGNOSIS — J45909 Unspecified asthma, uncomplicated: Secondary | ICD-10-CM | POA: Diagnosis not present

## 2019-02-18 DIAGNOSIS — Z79899 Other long term (current) drug therapy: Secondary | ICD-10-CM | POA: Insufficient documentation

## 2019-02-18 DIAGNOSIS — Z8719 Personal history of other diseases of the digestive system: Secondary | ICD-10-CM | POA: Diagnosis not present

## 2019-02-18 DIAGNOSIS — Z832 Family history of diseases of the blood and blood-forming organs and certain disorders involving the immune mechanism: Secondary | ICD-10-CM | POA: Insufficient documentation

## 2019-02-18 DIAGNOSIS — Z800 Family history of malignant neoplasm of digestive organs: Secondary | ICD-10-CM | POA: Insufficient documentation

## 2019-02-18 DIAGNOSIS — K645 Perianal venous thrombosis: Secondary | ICD-10-CM | POA: Diagnosis not present

## 2019-02-18 DIAGNOSIS — K644 Residual hemorrhoidal skin tags: Secondary | ICD-10-CM | POA: Insufficient documentation

## 2019-02-18 DIAGNOSIS — Z8249 Family history of ischemic heart disease and other diseases of the circulatory system: Secondary | ICD-10-CM | POA: Insufficient documentation

## 2019-02-18 DIAGNOSIS — Z833 Family history of diabetes mellitus: Secondary | ICD-10-CM | POA: Diagnosis not present

## 2019-02-18 DIAGNOSIS — Z825 Family history of asthma and other chronic lower respiratory diseases: Secondary | ICD-10-CM | POA: Diagnosis not present

## 2019-02-18 HISTORY — PX: EVALUATION UNDER ANESTHESIA WITH HEMORRHOIDECTOMY: SHX5624

## 2019-02-18 LAB — POCT PREGNANCY, URINE: Preg Test, Ur: NEGATIVE

## 2019-02-18 SURGERY — EXAM UNDER ANESTHESIA WITH HEMORRHOIDECTOMY
Anesthesia: General

## 2019-02-18 MED ORDER — PROMETHAZINE HCL 25 MG/ML IJ SOLN: 6.2500 mg | INTRAMUSCULAR | Status: DC | PRN

## 2019-02-18 MED ORDER — ONDANSETRON HCL 4 MG/2ML IJ SOLN
INTRAMUSCULAR | Status: DC | PRN
  Administered 2019-02-18: 4 mg via INTRAVENOUS

## 2019-02-18 MED ORDER — GELATIN ABSORBABLE 100 CM EX MISC
CUTANEOUS | Status: AC
  Filled 2019-02-18: qty 1

## 2019-02-18 MED ORDER — MEPERIDINE HCL 50 MG/ML IJ SOLN: 6.2500 mg | INTRAMUSCULAR | Status: DC | PRN

## 2019-02-18 MED ORDER — LIDOCAINE HCL (CARDIAC) PF 100 MG/5ML IV SOSY
PREFILLED_SYRINGE | INTRAVENOUS | Status: DC | PRN
  Administered 2019-02-18: 100 mg via INTRAVENOUS

## 2019-02-18 MED ORDER — BUPIVACAINE LIPOSOME 1.3 % IJ SUSP: 20.0000 mL | Freq: Once | INTRAMUSCULAR | Status: DC

## 2019-02-18 MED ORDER — OXYCODONE HCL 5 MG/5ML PO SOLN: 5.0000 mg | Freq: Once | ORAL | Status: DC | PRN

## 2019-02-18 MED ORDER — FAMOTIDINE 20 MG PO TABS: 20.0000 mg | ORAL_TABLET | Freq: Once | ORAL | Status: AC

## 2019-02-18 MED ORDER — MIDAZOLAM HCL 2 MG/2ML IJ SOLN
INTRAMUSCULAR | Status: DC | PRN
  Administered 2019-02-18: 2 mg via INTRAVENOUS

## 2019-02-18 MED ORDER — CHLORHEXIDINE GLUCONATE CLOTH 2 % EX PADS: 6.0000 | MEDICATED_PAD | Freq: Once | CUTANEOUS | Status: DC

## 2019-02-18 MED ORDER — IBUPROFEN 600 MG PO TABS: 600.0000 mg | ORAL_TABLET | Freq: Three times a day (TID) | ORAL | 1 refills | Status: AC | PRN

## 2019-02-18 MED ORDER — BUPIVACAINE LIPOSOME 1.3 % IJ SUSP
INTRAMUSCULAR | Status: DC | PRN
  Administered 2019-02-18: 20 mL

## 2019-02-18 MED ORDER — ACETAMINOPHEN 500 MG PO TABS
ORAL_TABLET | ORAL | Status: AC
  Administered 2019-02-18: 1000 mg via ORAL
  Filled 2019-02-18: qty 2

## 2019-02-18 MED ORDER — MIDAZOLAM HCL 2 MG/2ML IJ SOLN
INTRAMUSCULAR | Status: AC
  Filled 2019-02-18: qty 2

## 2019-02-18 MED ORDER — FLEET ENEMA 7-19 GM/118ML RE ENEM: 1.0000 | ENEMA | Freq: Once | RECTAL | Status: DC

## 2019-02-18 MED ORDER — SURGIFOAM 100 EX MISC
CUTANEOUS | Status: DC | PRN
  Administered 2019-02-18: 1 via TOPICAL

## 2019-02-18 MED ORDER — CIPROFLOXACIN IN D5W 400 MG/200ML IV SOLN
INTRAVENOUS | Status: AC
  Filled 2019-02-18: qty 200

## 2019-02-18 MED ORDER — OXYCODONE HCL 5 MG PO TABS: 5.0000 mg | ORAL_TABLET | Freq: Once | ORAL | Status: DC | PRN

## 2019-02-18 MED ORDER — GABAPENTIN 300 MG PO CAPS: 300.0000 mg | ORAL_CAPSULE | ORAL | Status: AC

## 2019-02-18 MED ORDER — BUPIVACAINE-EPINEPHRINE 0.5% -1:200000 IJ SOLN
INTRAMUSCULAR | Status: DC | PRN
  Administered 2019-02-18: 30 mL

## 2019-02-18 MED ORDER — LACTATED RINGERS IV SOLN: INTRAVENOUS | Status: DC

## 2019-02-18 MED ORDER — FAMOTIDINE 20 MG PO TABS
ORAL_TABLET | ORAL | Status: AC
  Administered 2019-02-18: 20 mg via ORAL
  Filled 2019-02-18: qty 1

## 2019-02-18 MED ORDER — FENTANYL CITRATE (PF) 100 MCG/2ML IJ SOLN: 25.0000 ug | INTRAMUSCULAR | Status: DC | PRN

## 2019-02-18 MED ORDER — FENTANYL CITRATE (PF) 100 MCG/2ML IJ SOLN
INTRAMUSCULAR | Status: AC
  Filled 2019-02-18: qty 2

## 2019-02-18 MED ORDER — FENTANYL CITRATE (PF) 100 MCG/2ML IJ SOLN
INTRAMUSCULAR | Status: DC | PRN
  Administered 2019-02-18: 50 ug via INTRAVENOUS
  Administered 2019-02-18 (×2): 25 ug via INTRAVENOUS

## 2019-02-18 MED ORDER — PROPOFOL 10 MG/ML IV BOLUS
INTRAVENOUS | Status: DC | PRN
  Administered 2019-02-18: 170 mg via INTRAVENOUS

## 2019-02-18 MED ORDER — OXYCODONE HCL 5 MG PO TABS: 5.0000 mg | ORAL_TABLET | ORAL | 0 refills | Status: DC | PRN

## 2019-02-18 MED ORDER — GABAPENTIN 300 MG PO CAPS
ORAL_CAPSULE | ORAL | Status: AC
  Administered 2019-02-18: 300 mg via ORAL
  Filled 2019-02-18: qty 1

## 2019-02-18 MED ORDER — LIDOCAINE 5 % EX OINT: 1.0000 "application " | TOPICAL_OINTMENT | Freq: Four times a day (QID) | CUTANEOUS | 1 refills | Status: DC | PRN

## 2019-02-18 MED ORDER — ACETAMINOPHEN 500 MG PO TABS: 1000.0000 mg | ORAL_TABLET | ORAL | Status: AC

## 2019-02-18 SURGICAL SUPPLY — 30 items
BRIEF STRETCH MATERNITY 2XLG (MISCELLANEOUS) ×3 IMPLANT
CANISTER SUCT 1200ML W/VALVE (MISCELLANEOUS) ×3 IMPLANT
COVER WAND RF STERILE (DRAPES) ×3 IMPLANT
DRAPE PERI LITHO V/GYN (MISCELLANEOUS) ×3 IMPLANT
DRAPE UNDER BUTTOCK W/FLU (DRAPES) ×3 IMPLANT
DRSG GAUZE FLUFF 36X18 (GAUZE/BANDAGES/DRESSINGS) ×5 IMPLANT
ELECT REM PT RETURN 9FT ADLT (ELECTROSURGICAL) ×3
ELECTRODE REM PT RTRN 9FT ADLT (ELECTROSURGICAL) ×1 IMPLANT
GLOVE SURG SYN 7.0 (GLOVE) ×3 IMPLANT
GLOVE SURG SYN 7.0 PF PI (GLOVE) ×1 IMPLANT
GLOVE SURG SYN 7.5  E (GLOVE) ×2
GLOVE SURG SYN 7.5 E (GLOVE) ×1 IMPLANT
GLOVE SURG SYN 7.5 PF PI (GLOVE) ×1 IMPLANT
GOWN STRL REUS W/ TWL LRG LVL3 (GOWN DISPOSABLE) ×2 IMPLANT
GOWN STRL REUS W/TWL LRG LVL3 (GOWN DISPOSABLE) ×4
KIT TURNOVER KIT A (KITS) ×3 IMPLANT
LABEL OR SOLS (LABEL) ×3 IMPLANT
LIGASURE IMPACT 36 18CM CVD LR (INSTRUMENTS) ×2 IMPLANT
NEEDLE HYPO 22GX1.5 SAFETY (NEEDLE) ×5 IMPLANT
NS IRRIG 500ML POUR BTL (IV SOLUTION) ×3 IMPLANT
PACK BASIN MINOR ARMC (MISCELLANEOUS) ×3 IMPLANT
PAD OB MATERNITY 4.3X12.25 (PERSONAL CARE ITEMS) ×3 IMPLANT
PAD PREP 24X41 OB/GYN DISP (PERSONAL CARE ITEMS) ×3 IMPLANT
SOL PREP PVP 2OZ (MISCELLANEOUS) ×3
SOLUTION PREP PVP 2OZ (MISCELLANEOUS) ×1 IMPLANT
SURGILUBE 2OZ TUBE FLIPTOP (MISCELLANEOUS) ×3 IMPLANT
SUT VIC AB 2-0 SH 27 (SUTURE) ×4
SUT VIC AB 2-0 SH 27XBRD (SUTURE) ×2 IMPLANT
SYR 10ML LL (SYRINGE) ×3 IMPLANT
SYR BULB IRRIG 60ML STRL (SYRINGE) ×3 IMPLANT

## 2019-02-18 NOTE — Addendum Note (Signed)
Addendum  created 02/18/19 1311 by Junious Silk, CRNA   Intraprocedure Staff edited

## 2019-02-18 NOTE — Anesthesia Preprocedure Evaluation (Signed)
Anesthesia Evaluation  Patient identified by MRN, date of birth, ID band Patient awake    Reviewed: Allergy & Precautions, NPO status , Patient's Chart, lab work & pertinent test results  History of Anesthesia Complications Negative for: history of anesthetic complications  Airway Mallampati: II  TM Distance: >3 FB Neck ROM: Full    Dental no notable dental hx.    Pulmonary asthma , neg sleep apnea,    breath sounds clear to auscultation- rhonchi (-) wheezing      Cardiovascular Exercise Tolerance: Good (-) hypertension(-) CAD, (-) Past MI, (-) Cardiac Stents and (-) CABG  Rhythm:Regular Rate:Normal - Systolic murmurs and - Diastolic murmurs    Neuro/Psych neg Seizures negative neurological ROS  negative psych ROS   GI/Hepatic negative GI ROS, Neg liver ROS,   Endo/Other  negative endocrine ROSneg diabetes  Renal/GU negative Renal ROS     Musculoskeletal negative musculoskeletal ROS (+)   Abdominal (+) - obese,   Peds  Hematology  (+) anemia ,   Anesthesia Other Findings Past Medical History: No date: Asthma No date: Seasonal allergies   Reproductive/Obstetrics                             Anesthesia Physical Anesthesia Plan  ASA: II  Anesthesia Plan: General   Post-op Pain Management:    Induction: Intravenous  PONV Risk Score and Plan: 2 and Ondansetron, Dexamethasone and Midazolam  Airway Management Planned: LMA  Additional Equipment:   Intra-op Plan:   Post-operative Plan:   Informed Consent: I have reviewed the patients History and Physical, chart, labs and discussed the procedure including the risks, benefits and alternatives for the proposed anesthesia with the patient or authorized representative who has indicated his/her understanding and acceptance.     Dental advisory given  Plan Discussed with: CRNA and Anesthesiologist  Anesthesia Plan Comments:          Anesthesia Quick Evaluation

## 2019-02-18 NOTE — OR Nursing (Signed)
Discharge pending transportation home. 

## 2019-02-18 NOTE — Anesthesia Procedure Notes (Signed)
Procedure Name: LMA Insertion Date/Time: 02/18/2019 11:35 AM Performed by: Eduardo Osier, CRNA Pre-anesthesia Checklist: Patient identified, Emergency Drugs available, Suction available, Patient being monitored and Timeout performed Patient Re-evaluated:Patient Re-evaluated prior to induction Oxygen Delivery Method: Circle system utilized Preoxygenation: Pre-oxygenation with 100% oxygen Induction Type: IV induction LMA: LMA inserted LMA Size: 4.0 Number of attempts: 1 Placement Confirmation: positive ETCO2,  breath sounds checked- equal and bilateral and CO2 detector Tube secured with: Tape

## 2019-02-18 NOTE — Discharge Instructions (Signed)

## 2019-02-18 NOTE — Transfer of Care (Signed)
Immediate Anesthesia Transfer of Care Note  Patient: Sheri Lee  Procedure(s) Performed: EXAM UNDER ANESTHESIA WITH HEMORRHOIDECTOMY (N/A )  Patient Location: PACU  Anesthesia Type:General  Level of Consciousness: awake and alert   Airway & Oxygen Therapy: Patient Spontanous Breathing  Post-op Assessment: Report given to RN  Post vital signs: Reviewed and stable  Last Vitals:  Vitals Value Taken Time  BP 124/66 02/18/19 1242  Temp 36 C 02/18/19 1242  Pulse 92 02/18/19 1243  Resp 18 02/18/19 1243  SpO2 99 % 02/18/19 1243  Vitals shown include unvalidated device data.  Last Pain:  Vitals:   02/18/19 0953  TempSrc: Temporal  PainSc: 0-No pain         Complications: No apparent anesthesia complications

## 2019-02-18 NOTE — Anesthesia Postprocedure Evaluation (Signed)
Anesthesia Post Note  Patient: Veneda Kirksey  Procedure(s) Performed: EXAM UNDER ANESTHESIA WITH HEMORRHOIDECTOMY (N/A )  Patient location during evaluation: PACU Anesthesia Type: General Level of consciousness: awake and alert and oriented Pain management: pain level controlled Vital Signs Assessment: post-procedure vital signs reviewed and stable Respiratory status: spontaneous breathing, nonlabored ventilation and respiratory function stable Cardiovascular status: blood pressure returned to baseline and stable Postop Assessment: no signs of nausea or vomiting Anesthetic complications: no     Last Vitals:  Vitals:   02/18/19 1258 02/18/19 1300  BP: 111/66   Pulse: 82 82  Resp: (!) 28 15  Temp:    SpO2: 100% 100%    Last Pain:  Vitals:   02/18/19 1258  TempSrc:   PainSc: 0-No pain                 Manaia Samad

## 2019-02-18 NOTE — Interval H&P Note (Signed)
History and Physical Interval Note:  02/18/2019 10:14 AM  Sheri Lee  has presented today for surgery, with the diagnosis of Enlarged external hemorrhoids.  The various methods of treatment have been discussed with the patient and family. After consideration of risks, benefits and other options for treatment, the patient has consented to  Procedure(s): EXAM UNDER ANESTHESIA WITH HEMORRHOIDECTOMY (N/A) as a surgical intervention.  The patient's history has been reviewed, patient examined, no change in status, stable for surgery.  I have reviewed the patient's chart and labs.  Questions were answered to the patient's satisfaction.     [name]

## 2019-02-18 NOTE — Op Note (Signed)
  Procedure Date:  02/18/2019  Pre-operative Diagnosis:   Enlarged external hemorrhoids  Post-operative Diagnosis:  Enlarged internal and external hemorrhoids  Procedure:  Exam under Anesthesia, Hemorrhoidectomy of right anterior and right posterior columns  Surgeon:  Howie Ill, MD  Assistant:  Simone Curia, PA-S  Anesthesia:  General endotracheal  Estimated Blood Loss:  10 ml  Specimens:  Right anterior and right posterior hemorrhoids  Complications:  None  Indications for Procedure:  This is a 24 y.o. female with recent history of thrombosed external hemorrhoid, with prior issues with her hemorrhoids in the past.  She wishes to have them excised.  The risks of bleeding, abscess or infection, injury to surrounding structures, and need for further procedures were all discussed with the patient and was willing to proceed.  Description of Procedure: The patient was correctly identified in the preoperative area and brought into the operating room.  The patient was placed supine with VTE prophylaxis in place.  Appropriate time-outs were performed.  Anesthesia was induced and the patient was intubated.  Bilateral lower extremities were raised on candy cane stirrups.  Appropriate antibiotics were infused.  The perianal area was prepped and draped in usual sterile fashion.  External exam revealed enlarged external hemorrhoids of all three columns, with left lateral being the smallest.  Digital rectal exam revealed somewhat enlarged right anterior and right posterior columns, but normal left lateral internal component.  There were no other lesions.  We proceeded with resection of the external and internal component of the right posterior column, using cautery and LigaSure.  2-0 Vicryl was used to secure the sealed edges for better approximation, leaving a small corner of the external component open for drainage.  Similarly, the right anterior component was excised and ligated.  Hemostasis  was good.  50 ml of Exparel mixed with 0.5% bupivacaine with epi was infiltrated in the perianal area for anesthesia.  A large piece of Gelfoam was rolled and placed in the anal canal.  The area was cleaned and dressed with fluffed gauze and mesh underwear.  The patient was emerged from anesthesia and extubated and brought to the recovery room for further management.  The patient tolerated the procedure well and all counts were correct at the end of the case.   Howie Ill, MD

## 2019-02-18 NOTE — Addendum Note (Signed)
Addendum  created 02/18/19 1318 by Eduardo Osier, CRNA   Attestation recorded in [place], [employer] filed

## 2019-02-19 LAB — SURGICAL PATHOLOGY

## 2019-02-20 ENCOUNTER — Telehealth: Payer: Self-pay | Admitting: *Deleted

## 2019-02-20 ENCOUNTER — Other Ambulatory Visit: Payer: Self-pay

## 2019-02-20 NOTE — Telephone Encounter (Signed)
Spoke with patient to inform her that per Dr.Piscoya says that what patient is describing is normal and maybe irritation from the breathing tube during surgery (02/18/19). Patient says that her Uvula is swollen and seems irritated and noticed a little white area on the bottom of it. I advised patient she may go to the nearest urgent care or emergency department. Patient verbalized understanding and states she will give our office a call back after she going to the Urgent care or ED.

## 2019-02-20 NOTE — Telephone Encounter (Signed)
Patient called and stated that she had surgery on 02/18/19 by Dr Aleen Campi Hemorrhoidectomy and she stated that her  throat still hurts from surgery and it feels like something is sitting on her tongue. Please call and advise

## 2019-03-04 ENCOUNTER — Encounter: Payer: Self-pay | Admitting: Surgery

## 2019-03-04 ENCOUNTER — Ambulatory Visit (INDEPENDENT_AMBULATORY_CARE_PROVIDER_SITE_OTHER): Payer: Self-pay | Admitting: Surgery

## 2019-03-04 ENCOUNTER — Other Ambulatory Visit: Payer: Self-pay

## 2019-03-04 VITALS — BP 111/74 | HR 76 | Temp 97.9°F | Ht 63.0 in | Wt 167.0 lb

## 2019-03-04 DIAGNOSIS — K645 Perianal venous thrombosis: Secondary | ICD-10-CM

## 2019-03-04 DIAGNOSIS — Z09 Encounter for follow-up examination after completed treatment for conditions other than malignant neoplasm: Secondary | ICD-10-CM

## 2019-03-04 NOTE — Patient Instructions (Addendum)
GENERAL POST-OPERATIVE PATIENT INSTRUCTIONS   May continue the lidocaine ointment. Continue to do the sitz baths at least twice daily and after bowel movements. Use a stool softer. Your pain will continue to get better over time as the area heals over.    Follow up here in 2 weeks on March 10th. To remain out of work until March 15th unless otherwise when we see you on March 10th.  DIET:  You may eat any foods that you can tolerate.  It is a good idea to eat a high fiber diet and take in plenty of fluids to prevent constipation.  If you do become constipated you may want to take a mild laxative or take ducolax tablets on a daily basis until your bowel habits are regular.  Constipation can be very uncomfortable, along with straining, after recent surgery.  ACTIVITY:  You are encouraged to cough and deep breath or use your incentive spirometer if you were given one, every 15-30 minutes when awake.  This will help prevent respiratory complications and low grade fevers post-operatively if you had a general anesthetic.  You may want to hug a pillow when coughing and sneezing to add additional support to the surgical area, if you had abdominal or chest surgery, which will decrease pain during these times.  You are encouraged to walk and engage in light activity for the next two weeks.  You should not lift more than 20 pounds for 6 weeks as it could put you at increased risk for complications.  Twenty pounds is roughly equivalent to a plastic bag of groceries. At that time- Listen to your body when lifting, if you have pain when lifting, stop and then try again in a few days. Soreness after doing exercises or activities of daily living is normal as you get back in to your normal routine.  MEDICATIONS:  Try to take narcotic medications and anti-inflammatory medications, such as tylenol, ibuprofen, naprosyn, etc., with food.  This will minimize stomach upset from the medication.  Should you develop nausea and  vomiting from the pain medication, or develop a rash, please discontinue the medication and contact your physician.  You should not drive, make important decisions, or operate machinery when taking narcotic pain medication.  QUESTIONS:  Please feel free to call our office if you have any questions, and we will be glad to assist you.

## 2019-03-04 NOTE — Progress Notes (Signed)
03/04/2019  HPI: Sheri Lee is a 24 y.o. female s/p hemorrhoidectomy of right anterior and posterior columns on 02/18/19.  She presents for follow up.  She reports that the pain has improved, but is still having some pain with bowel movements and she also notices some blood spotting as well.  Denies any significant bleeding.  Vital signs: BP 111/74   Pulse 76   Temp 97.9 F (36.6 C)   Ht 5\' 3"  (1.6 m)   Wt 167 lb (75.8 kg)   LMP 02/11/2019 (Exact Date)   SpO2 99%   BMI 29.58 kg/m    Physical Exam: Constitutional: No acute distress Rectal:  External exam reveals healthy granulation tissue healing of both columns.  The left lateral column externally is still mildly enlarged, without any tenderness.  There is tenderness only on the raw spots of the two columns.  Vicryl suture that was loose was cut down.  Assessment/Plan: This is a 24 y.o. female s/p hemorrhoidectomy of right anterior and posterior columns.  --Discussed with her that the tissue is healing well, with no evidence of infection.  However, given the open wounds, there is some degree of pain expected, particularly with bowel movements.  She can continue doing Sitz baths, using stool softeners, and she can also use her lidocaine ointment to help with pain. --Follow up in two weeks to assess her progress.  Will extend her out of work note until 3/15.   4/15, MD  [facility]

## 2019-03-09 ENCOUNTER — Telehealth: Payer: Self-pay | Admitting: *Deleted

## 2019-03-09 NOTE — Telephone Encounter (Signed)
Faxed FMLA/STD to [phone]

## 2019-03-18 ENCOUNTER — Other Ambulatory Visit: Payer: Self-pay

## 2019-03-18 ENCOUNTER — Encounter: Payer: BC Managed Care – PPO | Admitting: Surgery

## 2019-03-18 ENCOUNTER — Ambulatory Visit (INDEPENDENT_AMBULATORY_CARE_PROVIDER_SITE_OTHER): Payer: Self-pay | Admitting: Surgery

## 2019-03-18 ENCOUNTER — Encounter: Payer: Self-pay | Admitting: Emergency Medicine

## 2019-03-18 ENCOUNTER — Encounter: Payer: Self-pay | Admitting: Surgery

## 2019-03-18 VITALS — BP 114/75 | HR 87 | Temp 97.5°F | Resp 12 | Ht 63.0 in | Wt 167.0 lb

## 2019-03-18 DIAGNOSIS — K645 Perianal venous thrombosis: Secondary | ICD-10-CM

## 2019-03-18 DIAGNOSIS — Z09 Encounter for follow-up examination after completed treatment for conditions other than malignant neoplasm: Secondary | ICD-10-CM

## 2019-03-18 MED ORDER — LIDOCAINE 5 % EX OINT: 1.0000 "application " | TOPICAL_OINTMENT | Freq: Four times a day (QID) | CUTANEOUS | 1 refills | Status: AC | PRN

## 2019-03-18 MED ORDER — LIDOCAINE-PRILOCAINE 2.5-2.5 % EX CREA: TOPICAL_CREAM | CUTANEOUS | 0 refills | Status: DC

## 2019-03-18 NOTE — Patient Instructions (Addendum)
Please pick up your prescription at your local pharmacy.   You do not have a follow up appointment, but as discussed with Dr Aleen Campi if things are not improving in about one month call the office to set up and appointment.   Surgical Procedures for Hemorrhoids, Care After This sheet gives you information about how to care for yourself after your procedure. Your health care provider may also give you more specific instructions. If you have problems or questions, contact your health care provider. What can I expect after the procedure? After the procedure, it is common to have:  Rectal pain.  Pain when you are having a bowel movement.  Slight rectal bleeding. This is more likely to happen with the first bowel movement after surgery. Follow these instructions at home: Medicines  Take over-the-counter and prescription medicines only as told by your health care provider.  If you were prescribed an antibiotic medicine, use it as told by your health care provider. Do not stop using the antibiotic even if your condition improves.  Ask your health care provider if the medicine prescribed to you requires you to avoid driving or using heavy machinery.  Use a stool softener or a bulk laxative as told by your health care provider. Eating and drinking  Follow instructions from your health care provider about what to eat or drink after your procedure.  You may need to take actions to prevent or treat constipation, such as: ? Drink enough fluid to keep your urine pale yellow. ? Take over-the-counter or prescription medicines. ? Eat foods that are high in fiber, such as beans, whole grains, and fresh fruits and vegetables. ? Limit foods that are high in fat and processed sugars, such as fried or sweet foods. Activity   Rest as told by your health care provider.  Avoid sitting for a long time without moving. Get up to take short walks every 1-2 hours. This is important to improve blood flow and  breathing. Ask for help if you feel weak or unsteady.  Return to your normal activities as told by your health care provider. Ask your health care provider what activities are safe for you.  Do not lift anything that is heavier than 10 lb (4.5 kg), or the limit that you are told, until your health care provider says that it is safe.  Do not strain to have a bowel movement.  Do not spend a long time sitting on the toilet. General instructions   Take warm sitz baths for 15-20 minutes, 2-3 times a day to relieve soreness or itching and to keep the rectal area clean.  Apply ice packs to the area to reduce swelling and pain.  Do not drive for 24 hours if you were given a sedative during your procedure.  Keep all follow-up visits as told by your health care provider. This is important. Contact a health care provider if:  Your pain medicine is not helping.  You have a fever or chills.  You have bad smelling drainage.  You have a lot of swelling.  You become constipated.  You have trouble passing urine. Get help right away if:  You have very bad rectal pain.  You have heavy bleeding from your rectum. Summary  After the procedure, it is common to have pain and slight rectal bleeding.  Take warm sitz baths for 15-20 minutes, 2-3 times a day to relieve soreness or itching and to keep the rectal area clean.  Avoid straining when having a  bowel movement.  Eat foods that are high in fiber, such as beans, whole grains, and fresh fruits and vegetables.  Take over-the-counter and prescription medicines only as told by your health care provider. This information is not intended to replace advice given to you by your health care provider. Make sure you discuss any questions you have with your health care provider. Document Revised: 06/11/2018 Document Reviewed: 11/12/2017 Elsevier Patient Education  [place].

## 2019-03-18 NOTE — Progress Notes (Signed)
03/18/2019  HPI: Sheri Lee is a 24 y.o. female s/p hemorrhoidectomy of right anterior and posterior columns on 02/18/2019.  Patient presents today for follow-up.  Patient reports that her pain is significantly better compared to last visit and the amount of bleeding has also decreased.  She continues using sitz bath.  She is running out of her lidocaine ointment.  Vital signs: BP 114/75   Pulse 87   Temp (!) 97.5 F (36.4 C)   Resp 12   Ht 5\' 3"  (1.6 m)   Wt 167 lb (75.8 kg)   SpO2 97%   BMI 29.58 kg/m    Physical Exam: Constitutional: No acute distress Rectal: External exam reveals mildly enlarged left lateral external hemorrhoid.  This is stable compared to before.  There is healing tissue from her hemorrhoidectomy with good granulation tissue no evidence of bleeding at this point.  Digital rectal exam did not reveal any lesions or masses.  Assessment/Plan: This is a 24 y.o. female s/p hemorrhoidectomy of right anterior and posterior columns.  -Reassured the patient that currently she is healing well without any complications.  She is to continue with the sitz baths, maintain a good bowel regimen to prevent any constipation and use lidocaine ointment as needed.  We will refill her prescription today. -She may return to work next week.  We will give her a work note for this. -Offer the patient follow-up in 1 month to continue checking her healing versus watchful waiting and she has opted to call 30 if anything is needed.  She is aware that if in about a month her issues continue ongoing she should let us know to make sure there is nothing else going on.[place], MD [facility]

## 2020-06-28 ENCOUNTER — Other Ambulatory Visit: Payer: Self-pay

## 2020-06-28 ENCOUNTER — Encounter: Payer: Self-pay | Admitting: Emergency Medicine

## 2020-06-28 ENCOUNTER — Ambulatory Visit (INDEPENDENT_AMBULATORY_CARE_PROVIDER_SITE_OTHER)
Admit: 2020-06-28 | Discharge: 2020-06-28 | Disposition: A | Discharge status: Home / Self Care | Payer: BC Managed Care – PPO | Attending: Emergency Medicine | Admitting: Emergency Medicine

## 2020-06-28 ENCOUNTER — Ambulatory Visit
Admission: EM | Admit: 2020-06-28 | Discharge: 2020-06-28 | Disposition: A | Discharge status: Home / Self Care | Payer: BC Managed Care – PPO | Attending: Emergency Medicine | Admitting: Emergency Medicine

## 2020-06-28 DIAGNOSIS — N83202 Unspecified ovarian cyst, left side: Secondary | ICD-10-CM

## 2020-06-28 DIAGNOSIS — R1032 Left lower quadrant pain: Secondary | ICD-10-CM

## 2020-06-28 DIAGNOSIS — N8302 Follicular cyst of left ovary: Secondary | ICD-10-CM | POA: Insufficient documentation

## 2020-06-28 DIAGNOSIS — R319 Hematuria, unspecified: Secondary | ICD-10-CM | POA: Diagnosis not present

## 2020-06-28 DIAGNOSIS — R112 Nausea with vomiting, unspecified: Secondary | ICD-10-CM | POA: Diagnosis not present

## 2020-06-28 DIAGNOSIS — F17210 Nicotine dependence, cigarettes, uncomplicated: Secondary | ICD-10-CM | POA: Insufficient documentation

## 2020-06-28 LAB — POCT URINALYSIS DIP (DEVICE)
Bilirubin Urine: NEGATIVE
Glucose, UA: NEGATIVE mg/dL
Ketones, ur: NEGATIVE mg/dL
Leukocytes,Ua: NEGATIVE
Nitrite: NEGATIVE
Protein, ur: NEGATIVE mg/dL
Specific Gravity, Urine: 1.02 (ref 1.005–1.030)
Urobilinogen, UA: 2 mg/dL — ABNORMAL HIGH (ref 0.0–1.0)
pH: 7 (ref 5.0–8.0)

## 2020-06-28 LAB — POCT PREGNANCY, URINE: Preg Test, Ur: NEGATIVE

## 2020-06-28 MED ORDER — IBUPROFEN 600 MG PO TABS: 600.0000 mg | ORAL_TABLET | Freq: Four times a day (QID) | ORAL | 0 refills | Status: AC | PRN

## 2020-06-28 MED ORDER — ONDANSETRON 8 MG PO TBDP: ORAL_TABLET | ORAL | 0 refills | Status: DC

## 2020-06-28 NOTE — ED Provider Notes (Signed)
HPI  SUBJECTIVE:  Sheri Lee is a 25 y.o. female who presents with 2-3 days of sharp, constant left flank/left lower quadrant pain that radiates down to her pelvis.  She reports nausea, vomiting every time she eats.  She is tolerating fluids.  Denies anorexia.  She states that she feels distended on the side of the pain.  No diarrhea.  Had a normal bowel movement last night without any change in her pain.  No back pain, urinary complaints, vaginal odor, rash, abnormal discharge.  She states that her period is late, but that she started spotting today.  She states this feels similar to her previous ovarian cyst that she had during pregnancy.  No antipyretic in the past 6 hours.  She tried lying down, hot showers and baths, Tylenol 1000 mg x 1 with improvement in her symptoms.  Symptoms are worse with movement and standing up straight.  No anorexia.  Car ride over here was not painful.  She is in a long-term monogamous relationship with her husband who is asymptomatic, STDs are not a concern today.  She is status post tubal ligation, C-section.  She has a history of left ovarian cyst.  No history of UTI, pyelonephritis, nephrolithiasis, STD, BV, yeast, PID, diverticulitis, TOA, PCOS, ovarian torsion.  LMP: 5/16.  Family history significant for aunt and mother with nephrolithiasis.  PMD: [facility].   History reviewed. No pertinent past medical history.  Past Surgical History:  Procedure Laterality Date   CESAREAN SECTION N/A    x2   TUBAL LIGATION N/A     History reviewed. No pertinent family history.  Social History   Tobacco Use   Smoking status: Some Days    Pack years: 0.00    Types: Cigarettes   Smokeless tobacco: Never  Vaping Use   Vaping Use: Never used  Substance Use Topics   Alcohol use: Yes   Drug use: Not Currently    No current facility-administered medications for this encounter.  Current Outpatient Medications:    ibuprofen (ADVIL) 600 MG tablet, Take 1  tablet (600 mg total) by mouth every 6 (six) hours as needed., Disp: 30 tablet, Rfl: 0   ondansetron (ZOFRAN ODT) 8 MG disintegrating tablet, 1/2- 1 tablet q 8 hr prn nausea, vomiting, Disp: 20 tablet, Rfl: 0  No Known Allergies   ROS  As noted in HPI.   Physical Exam  BP 108/61 (BP Location: Right Arm)   Pulse 74   Temp 98.3 F (36.8 C) (Oral)   Resp 18   Ht 5\' 4"  (1.626 m)   Wt 70.3 kg   LMP 05/23/2020 (Approximate)   SpO2 100%   BMI 26.61 kg/m   Constitutional: Well developed, well nourished, no acute distress Eyes:  EOMI, conjunctiva normal bilaterally HENT: Normocephalic, atraumatic,mucus membranes moist Respiratory: Normal inspiratory effort Cardiovascular: Normal rate GI: Normal appearance, soft, nondistended.  Positive suprapubic, left flank, left lower quadrant tenderness.  No guarding, rebound.  Active bowel sounds. Back: No CVAT skin: No rash, skin intact Musculoskeletal: no deformities Neurologic: Alert & oriented x 3, no focal neuro deficits Psychiatric: Speech and behavior appropriate   ED Course   Medications - No data to display  Orders Placed This Encounter  Procedures   CT ABDOMEN PELVIS WO CONTRAST    Standing Status:   Standing    Number of Occurrences:   1    Order Specific Question:   Is Oral Contrast requested for this exam?    Answer:  No oral contrast    Order Specific Question:   Reason for No Oral Contrast    Answer:   Other    Order Specific Question:   Please answer why no oral contrast is requested    Answer:   not needed    Order Specific Question:   Call Results- Best Contact Number?    Answer:   [phone]   After Hours POC Urine Preg    Standing Status:   Standing    Number of Occurrences:   1   After Hours POC Urinalysis Dipstick    Standing Status:   Standing    Number of Occurrences:   1   POCT urinalysis dip (device)    Standing Status:   Standing    Number of Occurrences:   1   Pregnancy, urine POC    Standing  Status:   Standing    Number of Occurrences:   1    Results for orders placed or performed during the hospital encounter of 06/28/20 (from the past 24 hour(s))  POCT urinalysis dip (device)     Status: Abnormal   Collection Time: 06/28/20  5:42 PM  Result Value Ref Range   Glucose, UA NEGATIVE NEGATIVE mg/dL   Bilirubin Urine NEGATIVE NEGATIVE   Ketones, ur NEGATIVE NEGATIVE mg/dL   Specific Gravity, Urine 1.020 1.005 - 1.030   Hgb urine dipstick MODERATE (A) NEGATIVE   pH 7.0 5.0 - 8.0   Protein, ur NEGATIVE NEGATIVE mg/dL   Urobilinogen, UA 2.0 (H) 0.0 - 1.0 mg/dL   Nitrite NEGATIVE NEGATIVE   Leukocytes,Ua NEGATIVE NEGATIVE  Pregnancy, urine POC     Status: None   Collection Time: 06/28/20  5:47 PM  Result Value Ref Range   Preg Test, Ur NEGATIVE NEGATIVE   CT ABDOMEN PELVIS WO CONTRAST  Result Date: 06/28/2020 CLINICAL DATA:  Left lower quadrant abdominal pain. Nausea and vomiting. EXAM: CT ABDOMEN AND PELVIS WITHOUT CONTRAST TECHNIQUE: Multidetector CT imaging of the abdomen and pelvis was performed following the standard protocol without IV contrast. COMPARISON:  Contrast-enhanced CT 10/06/2014 FINDINGS: Lower chest: Lung bases are clear. Hepatobiliary: No focal liver abnormality is seen. No gallstones, gallbladder wall thickening, or biliary dilatation. Pancreas: Unremarkable noncontrast appearance. No peripancreatic inflammation. Spleen: Normal in size without focal abnormality. Adrenals/Urinary Tract: No adrenal nodule. No hydronephrosis or renal calculi. No perinephric edema. Decompressed ureters. Urinary bladder is partially distended. There is no bladder stone or wall thickening. Stomach/Bowel: Ingested material within the stomach. No small bowel obstruction or obvious inflammation. Detailed bowel assessment is limited in the absence of enteric contrast. Small to moderate colonic stool burden without inflammatory change. Normal air-filled appendix. Vascular/Lymphatic: Normal  caliber abdominal aorta. No portal venous or mesenteric gas. No bulky abdominopelvic adenopathy. Reproductive: Unremarkable CT appearance of the uterus. 18 mm follicular cyst in the left ovary. No suspicious adnexal mass. Other: Trace free fluid in the pelvis. No free air. No abdominal wall hernia. Musculoskeletal: There are no acute or suspicious osseous abnormalities. IMPRESSION: 1. No renal stones or obstructive uropathy. 2. An 18 mm left ovarian follicular cyst is considered physiologic in a patient of this age. There is no suspicious adnexal mass. Electronically Signed   By: Narda Rutherford M.D.   On: 06/28/2020 19:09    ED Clinical Impression  1. Cyst of left ovary   2. Left lower quadrant abdominal pain      ED Assessment/Plan  Patient with left lower quadrant/flank and suprapubic pain with hematuria.  In the differential is nephrolithiasis, ovarian cyst,, diverticulitis.  She is not pregnant.  Doubt TOA, ovarian torsion based on history.  She is in a long-term monogamous relationship , STDs are not a concern today.  Low concern for PID., Thus pelvic was deferred.  Patient declined pain or nausea medication.  Will CT abdomen pelvis without contrast.  Discussed this with patient.  She is amenable to this  States that she saw her OB/GYN yesterday, mentioned this to her.  She states that he had tenderness on the pelvic exam, but this was not addressed.  Note does not mention adnexal tenderness.  Reviewed imaging independently. No nephrolithiasis.  18 mm left ovarian follicular cyst.  No suspicious adnexal mass.  Trace free fluid in the pelvis.See radiology report for full details.  There is no evidence of nephrolithiasis on CT.  She has a 18 mm left ovarian cyst which is most likely the cause of her pain.  Home with Tylenol/ibuprofen, and Zofran.  Follow-up with PMD or her OB/GYN as needed.  ER return precautions given.  Discussed labs, imaging, MDM, treatment plan, and plan for follow-up with  patient. Discussed sn/sx that should prompt return to the ED. patient agrees with plan.   Meds ordered this encounter  Medications   ibuprofen (ADVIL) 600 MG tablet    Sig: Take 1 tablet (600 mg total) by mouth every 6 (six) hours as needed.    Dispense:  30 tablet    Refill:  0   ondansetron (ZOFRAN ODT) 8 MG disintegrating tablet    Sig: 1/2- 1 tablet q 8 hr prn nausea, vomiting    Dispense:  20 tablet    Refill:  0       *This clinic note was created using Scientist, clinical (histocompatibility and immunogenetics). Therefore, there may be occasional mistakes despite careful proofreading.  ?    Domenick Gong, MD 06/28/20 2047

## 2020-06-28 NOTE — ED Triage Notes (Addendum)
Pt c/o bilateral lower abdominal pain. Started about 2 days ago. She states her period is a week late, but her tubes are tied. Denies dysuria or vaginal discharge. She states before she came into the office she was "spotting". Pt has also had nausea and vomiting.

## 2020-06-28 NOTE — Discharge Instructions (Addendum)
Your CT shows that you have an ovarian cyst that measures 18 mm.  You do not have a kidney stone.  I suspect the blood in your urine is from your vaginal bleeding/spotting.  May take 600 mg of ibuprofen combined with 1000 mg of Tylenol together 3-4 times a day as needed for pain.  Take 1/2-1 tab of Zofran 3 times a day as needed for nausea and vomiting.  Go immediately to the ER if your pain is not managed with Tylenol/ibuprofen, changes, or for any other concerns.

## 2020-06-29 ENCOUNTER — Encounter: Payer: Self-pay | Admitting: Surgery

## 2020-06-29 NOTE — ED Notes (Signed)
Patient has been approved for CPT [phone]. Obtained via [employer] . Valid for 06/28/2020 through 06/28/2020. Authorization # [id number].

## 2020-11-28 ENCOUNTER — Emergency Department: Payer: BC Managed Care – PPO

## 2020-11-28 ENCOUNTER — Encounter: Payer: Self-pay | Admitting: Emergency Medicine

## 2020-11-28 ENCOUNTER — Other Ambulatory Visit: Payer: Self-pay

## 2020-11-28 ENCOUNTER — Emergency Department
Admission: EM | Admit: 2020-11-28 | Discharge: 2020-11-28 | Disposition: A | Discharge status: Home / Self Care | Payer: BC Managed Care – PPO | Attending: Emergency Medicine | Admitting: Emergency Medicine

## 2020-11-28 DIAGNOSIS — J45909 Unspecified asthma, uncomplicated: Secondary | ICD-10-CM | POA: Insufficient documentation

## 2020-11-28 DIAGNOSIS — Z20822 Contact with and (suspected) exposure to covid-19: Secondary | ICD-10-CM | POA: Diagnosis not present

## 2020-11-28 DIAGNOSIS — Z7952 Long term (current) use of systemic steroids: Secondary | ICD-10-CM | POA: Diagnosis not present

## 2020-11-28 DIAGNOSIS — R112 Nausea with vomiting, unspecified: Secondary | ICD-10-CM | POA: Insufficient documentation

## 2020-11-28 DIAGNOSIS — R509 Fever, unspecified: Secondary | ICD-10-CM | POA: Diagnosis present

## 2020-11-28 DIAGNOSIS — J101 Influenza due to other identified influenza virus with other respiratory manifestations: Secondary | ICD-10-CM

## 2020-11-28 DIAGNOSIS — J09X2 Influenza due to identified novel influenza A virus with other respiratory manifestations: Secondary | ICD-10-CM | POA: Diagnosis not present

## 2020-11-28 DIAGNOSIS — F17210 Nicotine dependence, cigarettes, uncomplicated: Secondary | ICD-10-CM | POA: Diagnosis not present

## 2020-11-28 LAB — RESP PANEL BY RT-PCR (FLU A&B, COVID) ARPGX2
Influenza A by PCR: POSITIVE — AB
Influenza B by PCR: NEGATIVE
SARS Coronavirus 2 by RT PCR: NEGATIVE

## 2020-11-28 MED ORDER — ONDANSETRON 4 MG PO TBDP: 4.0000 mg | ORAL_TABLET | Freq: Three times a day (TID) | ORAL | 0 refills | Status: AC | PRN

## 2020-11-28 MED ORDER — FAMOTIDINE 20 MG PO TABS: 20.0000 mg | ORAL_TABLET | Freq: Two times a day (BID) | ORAL | 0 refills | Status: AC

## 2020-11-28 NOTE — ED Triage Notes (Signed)
Mom reports productive cough with pain, fever and congestion with bodyaches for the past few days.

## 2020-11-28 NOTE — ED Provider Notes (Signed)
[facility] Emergency Department Provider Note  ____________________________________________  Time seen: Approximately 1:50 PM  I have reviewed the triage vital signs and the nursing notes.   HISTORY  Chief Complaint Generalized Body Aches, Cough, Fever, and Emesis    HPI Sheri Lee is a 25 y.o. female with no significant past medical history who comes ED complaining of fever cough congestion for the past 3 days.  Positive vomiting.  Symptoms are constant, waxing and waning.  No chest pain or shortness of breath.  No aggravating or alleviating factors     Past Medical History:  Diagnosis Date   Asthma    Seasonal allergies      Patient Active Problem List   Diagnosis Date Noted   Thrombosed external hemorrhoid    Labor and delivery indication for care or intervention 11/17/2017   Low back pain 11/16/2017   Acute blood loss anemia 03/22/2016   S/P cesarean section 03/22/2016   Labor and delivery, indication for care 03/18/2016     Past Surgical History:  Procedure Laterality Date   CESAREAN SECTION N/A 03/19/2016   Procedure: CESAREAN SECTION;  Surgeon: Elenora Fender Ward, MD;  Location: [facility] ORS;  Service: Obstetrics;  Laterality: N/A;   CESAREAN SECTION N/A 11/17/2017   Procedure: CESAREAN SECTION;  Surgeon: Ward, Elenora Fender, MD;  Location: [facility] ORS;  Service: Obstetrics;  Laterality: N/A;   CESAREAN SECTION N/A    x2   EVALUATION UNDER ANESTHESIA WITH HEMORRHOIDECTOMY N/A 02/18/2019   Procedure: EXAM UNDER ANESTHESIA WITH HEMORRHOIDECTOMY;  Surgeon: Henrene Dodge, MD;  Location: [facility] ORS;  Service: General;  Laterality: N/A;   I&D hemorrhoids  2018   KNEE SURGERY Left    arthroscopic   TUBAL LIGATION N/A      Prior to Admission medications   Medication Sig Start Date End Date Taking? Authorizing Provider  famotidine (PEPCID) 20 MG tablet Take 1 tablet (20 mg total) by mouth 2 (two) times daily. 11/28/20  Yes Sharman Cheek, MD   ondansetron (ZOFRAN ODT) 4 MG disintegrating tablet Take 1 tablet (4 mg total) by mouth every 8 (eight) hours as needed for nausea or vomiting. 11/28/20  Yes Sharman Cheek, MD  albuterol (VENTOLIN HFA) 108 (90 Base) MCG/ACT inhaler Inhale 1-2 puffs into the lungs every 6 (six) hours as needed for wheezing or shortness of breath.    [provider]  ibuprofen (ADVIL) 600 MG tablet Take 1 tablet (600 mg total) by mouth every 8 (eight) hours as needed. 02/18/19   Henrene Dodge, MD  ibuprofen (ADVIL) 600 MG tablet Take 1 tablet (600 mg total) by mouth every 6 (six) hours as needed. 06/28/20   Domenick Gong, MD  lidocaine (XYLOCAINE) 5 % ointment Apply 1 application topically 4 (four) times daily as needed. Apply to the perianal area up to 4 times daily as needed for pain. 03/18/19   Henrene Dodge, MD     Allergies Patient has no known allergies.   Family History  Problem Relation Age of Onset   Anemia Mother    Hypertension Mother    Lupus Father    Hypertension Father    Asthma Brother    Colon cancer Maternal Grandmother    Diabetes Maternal Grandfather    Hypertension Maternal Grandfather    Allergies Paternal Grandfather    Lupus Maternal Uncle    Hypertension Maternal Uncle    Hypertension Maternal Aunt     Social History Social History   Tobacco Use   Smoking status: Some  Days    Types: Cigarettes   Smokeless tobacco: Never  Vaping Use   Vaping Use: Never used  Substance Use Topics   Alcohol use: Yes   Drug use: Not Currently    Review of Systems Constitutional:   Positive fever chills.  ENT:   Positive sore throat.  Positive rhinorrhea. Cardiovascular:   No chest pain or syncope. Respiratory:   No dyspnea positive cough. Gastrointestinal:   Negative for abdominal pain, vomiting and diarrhea.  Musculoskeletal:   Negative for focal pain or swelling All other systems reviewed and are negative except as documented above in ROS and  HPI.  ____________________________________________   PHYSICAL EXAM:  VITAL SIGNS: ED Triage Vitals [11/28/20 1233]  Enc Vitals Group     BP      Pulse      Resp      Temp      Temp src      SpO2      Weight 140 lb (63.5 kg)     Height 5\' 3"  (1.6 m)     Head Circumference      Peak Flow      Pain Score 9     Pain Loc      Pain Edu?      Excl. in GC?     Vital signs reviewed, nursing assessments reviewed.  Constitutional:   Alert and oriented. Non-toxic appearance. Eyes:   Conjunctivae are normal. EOMI. ENT      Head:   Normocephalic and atraumatic.      Mouth/Throat:   MMM      Neck:   No meningismus. Full ROM. Hematological/Lymphatic/Immunilogical:   No cervical lymphadenopathy. Cardiovascular:   RRR. . Cap refill less than 2 seconds. Respiratory:   Normal respiratory effort without tachypnea/retractions. Breath sounds are clear and equal bilaterally. No wheezes/rales/rhonchi. Gastrointestinal:   Soft and nontender. Non distended.  No rebound, rigidity, or guarding. Musculoskeletal:   Normal range of motion in all extremities.  No edema. Neurologic:   Normal speech and language.  Motor grossly intact. No acute focal neurologic deficits are appreciated.  Skin:    Skin is warm, dry and intact. No rash noted.  No wounds.  ____________________________________________    LABS (pertinent positives/negatives) (all labs ordered are listed, but only abnormal results are displayed) Labs Reviewed  RESP PANEL BY RT-PCR (FLU A&B, COVID) ARPGX2 - Abnormal; Notable for the following components:      Result Value   Influenza A by PCR POSITIVE (*)    All other components within normal limits   ____________________________________________   EKG  ____________________________________________    RADIOLOGY  DG Chest 2 View  Result Date: 11/28/2020 CLINICAL DATA:  Cough, fever EXAM: CHEST - 2 VIEW COMPARISON:  None. FINDINGS: The heart size and mediastinal contours are  within normal limits. Both lungs are clear. The visualized skeletal structures are unremarkable. IMPRESSION: No acute abnormality of the lungs. Electronically Signed   By: 11/30/2020 M.D.   On: 11/28/2020 13:19    ____________________________________________   PROCEDURES Procedures  ____________________________________________  CLINICAL IMPRESSION / ASSESSMENT AND PLAN / ED COURSE  Pertinent labs & imaging results that were available during my care of the patient were reviewed by me and considered in my medical decision making (see chart for details).  Sheri Lee was evaluated in Emergency Department on 11/28/2020 for the symptoms described in the history of present illness. She was evaluated in the context of the global COVID-19 pandemic, which necessitated consideration  that the patient might be at risk for infection with the SARS-CoV-2 virus that causes COVID-19. Institutional protocols and algorithms that pertain to the evaluation of patients at risk for COVID-19 are in a state of rapid change based on information released by regulatory bodies including the CDC and federal and state organizations. These policies and algorithms were followed during the patient's care in the ED.   Patient presents with multiple constitutional symptoms.  Viral swab positive for influenza A.  Patient is nontoxic, tolerating oral intake, stable for discharge.  Will prescribe Zofran and Pepcid to help control gastritis symptoms.      ____________________________________________   FINAL CLINICAL IMPRESSION(S) / ED DIAGNOSES    Final diagnoses:  Influenza A  Nausea and vomiting, unspecified vomiting type     ED Discharge Orders          Ordered    ondansetron (ZOFRAN ODT) 4 MG disintegrating tablet  Every 8 hours PRN        11/28/20 1352    famotidine (PEPCID) 20 MG tablet  2 times daily        11/28/20 1352            Portions of this note were generated with dragon dictation  software. Dictation errors may occur despite best attempts at proofreading.   Sharman Cheek, MD 11/28/20 1407

## 2020-11-28 NOTE — ED Provider Notes (Signed)
Emergency Medicine Provider Triage Evaluation Note  Sheri Lee , a 25 y.o. female  was evaluated in triage.  Pt complains of flulike symptoms since Friday.  Review of Systems  Positive: Fever, chills, body aches, congestion with cough, few episodes of vomiting Negative: No diarrhea, no shortness of breath  Physical Exam  Ht 5\' 3"  (1.6 m)   Wt 63.5 kg   LMP 11/21/2020 (Approximate)   BMI 24.80 kg/m  Gen:   Awake, no distress   Resp:  Normal effort  MSK:   Moves extremities without difficulty  Other:    Medical Decision Making  Medically screening exam initiated at 12:36 PM.  Appropriate orders placed.  Sheri Lee was informed that the remainder of the evaluation will be completed by another provider, this initial triage assessment does not replace that evaluation, and the importance of remaining in the ED until their evaluation is complete.  Flu swab and chest x-ray due to patient's complaints of pain in her chest with cough   Adin Hector, PA-C 11/28/20 1237    11/30/20, MD 11/28/20 1248

## 2020-11-30 ENCOUNTER — Ambulatory Visit: Payer: Self-pay | Admitting: *Deleted

## 2020-11-30 NOTE — Telephone Encounter (Signed)
C/o vomiting every time she moves . Started vomiting Sunday 11/27/20 and dx with flu Monday 11/28/20. C/o headache, lightheaded, dry mouth, decreased urine output, chest pain, smell of food causes vomiting. Vomiting yellow , green emesis. Reports holding on to things to walk. Instructed patient to go to ED now . Care advise given. Patient verbalized understanding of care advise and to got to ED now or call 911 if symptoms worsen.

## 2020-11-30 NOTE — Telephone Encounter (Signed)
Reason for Disposition  [1] Drinking very little AND [2] dehydration suspected (e.g., no urine > 12 hours, very dry mouth, very lightheaded)  Answer Assessment - Initial Assessment Questions 1. VOMITING SEVERITY: "How many times have you vomited in the past 24 hours?"     - MILD:  1 - 2 times/day    - MODERATE: 3 - 5 times/day, decreased oral intake without significant weight loss or symptoms of dehydration    - SEVERE: 6 or more times/day, vomits everything or nearly everything, with significant weight loss, symptoms of dehydration      Everytime getting up or move  2. ONSET: "When did the vomiting begin?"      Since Sunday 11/27/20 3. FLUIDS: "What fluids or food have you vomited up today?" "Have you been able to keep any fluids down?"     All of them , not able to hold fluids or food  4. ABDOMINAL PAIN: "Are your having any abdominal pain?" If yes : "How bad is it and what does it feel like?" (e.g., crampy, dull, intermittent, constant)      Lower abdominal dull ache  5. DIARRHEA: "Is there any diarrhea?" If Yes, ask: "How many times today?"      no 6. CONTACTS: "Is there anyone else in the family with the same symptoms?"      No  7. CAUSE: "What do you think is causing your vomiting?"     Flu  8. HYDRATION STATUS: "Any signs of dehydration?" (e.g., dry mouth [not only dry lips], too weak to stand) "When did you last urinate?"     Yes dry mouth and lips, weak standing , lightheaded 9. OTHER SYMPTOMS: "Do you have any other symptoms?" (e.g., fever, headache, vertigo, vomiting blood or coffee grounds, recent head injury)     Headache, weak standing , vomiting yellow green emesis, chest pain  10. PREGNANCY: "Is there any chance you are pregnant?" "When was your last menstrual period?"       "Tubes tied"  Protocols used: Vomiting-A-AH

## 2020-12-01 ENCOUNTER — Emergency Department
Admission: EM | Admit: 2020-12-01 | Discharge: 2020-12-01 | Disposition: A | Discharge status: Home / Self Care | Payer: BC Managed Care – PPO | Attending: Emergency Medicine | Admitting: Emergency Medicine

## 2020-12-01 ENCOUNTER — Other Ambulatory Visit: Payer: Self-pay

## 2020-12-01 ENCOUNTER — Encounter: Payer: Self-pay | Admitting: Emergency Medicine

## 2020-12-01 ENCOUNTER — Emergency Department: Payer: BC Managed Care – PPO

## 2020-12-01 DIAGNOSIS — R1084 Generalized abdominal pain: Secondary | ICD-10-CM | POA: Insufficient documentation

## 2020-12-01 DIAGNOSIS — R059 Cough, unspecified: Secondary | ICD-10-CM | POA: Diagnosis present

## 2020-12-01 DIAGNOSIS — F17210 Nicotine dependence, cigarettes, uncomplicated: Secondary | ICD-10-CM | POA: Insufficient documentation

## 2020-12-01 DIAGNOSIS — J111 Influenza due to unidentified influenza virus with other respiratory manifestations: Secondary | ICD-10-CM | POA: Insufficient documentation

## 2020-12-01 DIAGNOSIS — J45909 Unspecified asthma, uncomplicated: Secondary | ICD-10-CM | POA: Insufficient documentation

## 2020-12-01 LAB — BASIC METABOLIC PANEL
Anion gap: 7 (ref 5–15)
BUN: 13 mg/dL (ref 6–20)
CO2: 27 mmol/L (ref 22–32)
Calcium: 8.5 mg/dL — ABNORMAL LOW (ref 8.9–10.3)
Chloride: 99 mmol/L (ref 98–111)
Creatinine, Ser: 0.91 mg/dL (ref 0.44–1.00)
GFR, Estimated: >60 mL/min (ref >60)
Glucose, Bld: 84 mg/dL (ref 70–99)
Potassium: 3.5 mmol/L (ref 3.5–5.1)
Sodium: 133 mmol/L — ABNORMAL LOW (ref 135–145)

## 2020-12-01 LAB — HCG, QUANTITATIVE, PREGNANCY: hCG, Beta Chain, Quant, S: 1 m[IU]/mL (ref <5)

## 2020-12-01 MED ORDER — LACTATED RINGERS IV BOLUS
1000.0000 mL | Freq: Once | INTRAVENOUS | Status: AC
  Administered 2020-12-01: 1000 mL via INTRAVENOUS

## 2020-12-01 MED ORDER — IBUPROFEN 400 MG PO TABS
400.0000 mg | ORAL_TABLET | Freq: Once | ORAL | Status: DC
  Filled 2020-12-01: qty 1

## 2020-12-01 MED ORDER — ONDANSETRON 4 MG PO TBDP
4.0000 mg | ORAL_TABLET | Freq: Once | ORAL | Status: AC | PRN
  Administered 2020-12-01: 4 mg via ORAL
  Filled 2020-12-01: qty 1

## 2020-12-01 MED ORDER — METOCLOPRAMIDE HCL 5 MG/ML IJ SOLN
10.0000 mg | Freq: Once | INTRAMUSCULAR | Status: AC
  Administered 2020-12-01: 10 mg via INTRAVENOUS
  Filled 2020-12-01: qty 2

## 2020-12-01 MED ORDER — ACETAMINOPHEN 500 MG PO TABS
1000.0000 mg | ORAL_TABLET | Freq: Once | ORAL | Status: DC
  Filled 2020-12-01: qty 2

## 2020-12-01 NOTE — ED Notes (Signed)
8 oz water given to patient.

## 2020-12-01 NOTE — ED Triage Notes (Signed)
Pt reports was seen here and tested positive for flu. Pt reports does not feel better and has had some vomiting as well now.

## 2020-12-01 NOTE — ED Provider Notes (Signed)
[facility] Emergency Department Provider Note  ____________________________________________   Event Date/Time   First MD Initiated Contact with Patient 12/01/20 1020     (approximate)  I have reviewed the triage vital signs and the nursing notes.   HISTORY  Chief Complaint Influenza   HPI Sheri Lee is a 25 y.o. female without significant past medical history aside from remote asthma who presents for 6 months of persistent nausea and vomiting associated with some generalized crampy abdominal pain, cough, congestion, headaches, myalgias, fevers, chills and decreased p.o. intake over the last 3 to 4 days.  Patient notes she was diagnosed with influenza on 11/21 and has been taking Zofran that was prescribed at that time but does not feel this is helping.  However she has not had any today.  No new symptoms today as all the symptoms seem to have been ongoing since she was initially diagnosed.  She denies EtOH use, illicit drug use or tobacco abuse.  No other acute concerns at this time         Past Medical History:  Diagnosis Date   Asthma    Seasonal allergies     Patient Active Problem List   Diagnosis Date Noted   Thrombosed external hemorrhoid    Labor and delivery indication for care or intervention 11/17/2017   Low back pain 11/16/2017   Acute blood loss anemia 03/22/2016   S/P cesarean section 03/22/2016   Labor and delivery, indication for care 03/18/2016    Past Surgical History:  Procedure Laterality Date   CESAREAN SECTION N/A 03/19/2016   Procedure: CESAREAN SECTION;  Surgeon: Elenora Fender Ward, MD;  Location: [facility] ORS;  Service: Obstetrics;  Laterality: N/A;   CESAREAN SECTION N/A 11/17/2017   Procedure: CESAREAN SECTION;  Surgeon: Ward, Elenora Fender, MD;  Location: [facility] ORS;  Service: Obstetrics;  Laterality: N/A;   CESAREAN SECTION N/A    x2   EVALUATION UNDER ANESTHESIA WITH HEMORRHOIDECTOMY N/A 02/18/2019   Procedure: EXAM UNDER  ANESTHESIA WITH HEMORRHOIDECTOMY;  Surgeon: Henrene Dodge, MD;  Location: [facility] ORS;  Service: General;  Laterality: N/A;   I&D hemorrhoids  2018   KNEE SURGERY Left    arthroscopic   TUBAL LIGATION N/A     Prior to Admission medications   Medication Sig Start Date End Date Taking? Authorizing Provider  albuterol (VENTOLIN HFA) 108 (90 Base) MCG/ACT inhaler Inhale 1-2 puffs into the lungs every 6 (six) hours as needed for wheezing or shortness of breath.    [provider]  famotidine (PEPCID) 20 MG tablet Take 1 tablet (20 mg total) by mouth 2 (two) times daily. 11/28/20   Sharman Cheek, MD  ibuprofen (ADVIL) 600 MG tablet Take 1 tablet (600 mg total) by mouth every 8 (eight) hours as needed. 02/18/19   Henrene Dodge, MD  ibuprofen (ADVIL) 600 MG tablet Take 1 tablet (600 mg total) by mouth every 6 (six) hours as needed. 06/28/20   Domenick Gong, MD  lidocaine (XYLOCAINE) 5 % ointment Apply 1 application topically 4 (four) times daily as needed. Apply to the perianal area up to 4 times daily as needed for pain. 03/18/19   Piscoya, Elita Quick, MD  ondansetron (ZOFRAN ODT) 4 MG disintegrating tablet Take 1 tablet (4 mg total) by mouth every 8 (eight) hours as needed for nausea or vomiting. 11/28/20   Sharman Cheek, MD    Allergies Patient has no known allergies.  Family History  Problem Relation Age of Onset   Anemia Mother  Hypertension Mother    Lupus Father    Hypertension Father    Asthma Brother    Colon cancer Maternal Grandmother    Diabetes Maternal Grandfather    Hypertension Maternal Grandfather    Allergies Paternal Grandfather    Lupus Maternal Uncle    Hypertension Maternal Uncle    Hypertension Maternal Aunt     Social History Social History   Tobacco Use   Smoking status: Some Days    Types: Cigarettes   Smokeless tobacco: Never  Vaping Use   Vaping Use: Never used  Substance Use Topics   Alcohol use: Yes   Drug use: Not Currently    Review  of Systems  Review of Systems  Constitutional:  Positive for chills, fever and malaise/fatigue.  HENT:  Negative for sore throat.   Eyes:  Negative for pain.  Respiratory:  Positive for cough. Negative for stridor.   Cardiovascular:  Negative for chest pain.  Gastrointestinal:  Positive for abdominal pain, nausea and vomiting.  Genitourinary:  Negative for dysuria.  Musculoskeletal:  Positive for myalgias.  Skin:  Negative for rash.  Neurological:  Negative for seizures, loss of consciousness and headaches.  Psychiatric/Behavioral:  Negative for suicidal ideas.   All other systems reviewed and are negative.    ____________________________________________   PHYSICAL EXAM:  VITAL SIGNS: ED Triage Vitals  Enc Vitals Group     BP 12/01/20 0952 120/81     Pulse Rate 12/01/20 0952 93     Resp 12/01/20 0952 20     Temp 12/01/20 0952 98.2 F (36.8 C)     Temp Source 12/01/20 0952 Oral     SpO2 12/01/20 0952 99 %     Weight 12/01/20 0950 143 lb 4.8 oz (65 kg)     Height 12/01/20 0950 5\' 3"  (1.6 m)     Head Circumference --      Peak Flow --      Pain Score 12/01/20 0950 0     Pain Loc --      Pain Edu? --      Excl. in GC? --    Vitals:   12/01/20 0952  BP: 120/81  Pulse: 93  Resp: 20  Temp: 98.2 F (36.8 C)  SpO2: 99%   Physical Exam Vitals and nursing note reviewed.  Constitutional:      General: She is not in acute distress.    Appearance: She is well-developed.  HENT:     Head: Normocephalic and atraumatic.     Right Ear: External ear normal.     Left Ear: External ear normal.     Nose: Nose normal.     Mouth/Throat:     Mouth: Mucous membranes are dry.  Eyes:     Conjunctiva/sclera: Conjunctivae normal.  Cardiovascular:     Rate and Rhythm: Normal rate and regular rhythm.     Heart sounds: No murmur heard. Pulmonary:     Effort: Pulmonary effort is normal. No respiratory distress.     Breath sounds: Normal breath sounds.  Abdominal:     Palpations:  Abdomen is soft.     Tenderness: There is no abdominal tenderness.  Musculoskeletal:        General: No swelling.     Cervical back: Neck supple.  Skin:    General: Skin is warm and dry.     Capillary Refill: Capillary refill takes 2 to 3 seconds.  Neurological:     Mental Status: She is alert.  Psychiatric:  Mood and Affect: Mood normal.     ____________________________________________   LABS (all labs ordered are listed, but only abnormal results are displayed)  Labs Reviewed  BASIC METABOLIC PANEL - Abnormal; Notable for the following components:      Result Value   Sodium 133 (*)    Calcium 8.5 (*)    All other components within normal limits  HCG, QUANTITATIVE, PREGNANCY  POC URINE PREG, ED   ____________________________________________  EKG  ____________________________________________  RADIOLOGY  ED MD interpretation: Chest x-ray shows no focal consolidation, effusion, edema, pneumothorax or any other clear acute intrathoracic process.  Official radiology report(s): DG Chest 2 View  Result Date: 12/01/2020 CLINICAL DATA:  Cough. EXAM: CHEST - 2 VIEW COMPARISON:  Chest x-ray dated November 28, 2020. FINDINGS: The heart size and mediastinal contours are within normal limits. Both lungs are clear. The visualized skeletal structures are unremarkable. IMPRESSION: No active cardiopulmonary disease. Electronically Signed   By: Obie Dredge M.D.   On: 12/01/2020 12:05    ____________________________________________   PROCEDURES  Procedure(s) performed (including Critical Care):  Procedures   ____________________________________________   INITIAL IMPRESSION / ASSESSMENT AND PLAN / ED COURSE      Patient presents with above-stated history and exam for evaluation of some persistent nausea and vomiting which she feels is not responding to Zofran prescribed after recent diagnosis of influenza.  She has above-noted consolation of symptoms well including  some crampy abdominal pain, cough, congestion, headache, body aches and decreased appetite.  Does appear mildly dehydrated on arrival.  Attempted trial of ODT Zofran while patient is still too nauseous to hydrate after this.  She was also given some IV fluids and Reglan.  Following this she was able tolerate p.o. without difficulty and she external Rx for antiemetics and she reports to try Zofran again.  She is not pregnant and BMP shows no significant electrolyte metabolic derangements.  Advised to continue to take ibuprofen as well.  She was offered this emergency room although declines it stating she was today at home.  Chest x-ray shows no focal consolidation, effusion, edema, pneumothorax or any other clear acute intrathoracic process.  At this time I have low suspicion for bacterial pneumonia, sepsis, other significant metabolic derangements I think she is stable for discharge with outpatient follow-up.  Discharged stable condition.  Strict return precautions advised and discussed.     ____________________________________________   FINAL CLINICAL IMPRESSION(S) / ED DIAGNOSES  Final diagnoses:  Influenza    Medications  acetaminophen (TYLENOL) tablet 1,000 mg (has no administration in time range)  ibuprofen (ADVIL) tablet 400 mg (has no administration in time range)  ondansetron (ZOFRAN-ODT) disintegrating tablet 4 mg (4 mg Oral Given 12/01/20 1052)  lactated ringers bolus 1,000 mL (0 mLs Intravenous Stopped 12/01/20 1301)  metoCLOPramide (REGLAN) injection 10 mg (10 mg Intravenous Given 12/01/20 1132)     ED Discharge Orders     None        Note:  This document was prepared using Dragon voice recognition software and may include unintentional dictation errors.    Gilles Chiquito, MD 12/01/20 1302

## 2020-12-01 NOTE — ED Notes (Signed)
Pt states she threw up water at this time

## 2020-12-01 NOTE — ED Notes (Signed)
Pt cleaned up and given gauze after accidentally pulling IV out. RN made aware.

## 2020-12-01 NOTE — ED Triage Notes (Signed)
Pt requesting to be given some IV fluids and states she is dehydrated.

## 2021-04-21 ENCOUNTER — Encounter: Payer: Self-pay | Admitting: Family Medicine

## 2021-04-21 ENCOUNTER — Ambulatory Visit: Payer: Self-pay | Admitting: Family Medicine

## 2021-04-21 DIAGNOSIS — Z113 Encounter for screening for infections with a predominantly sexual mode of transmission: Secondary | ICD-10-CM

## 2021-04-21 DIAGNOSIS — F419 Anxiety disorder, unspecified: Secondary | ICD-10-CM

## 2021-04-21 LAB — WET PREP FOR TRICH, YEAST, CLUE
Trichomonas Exam: NEGATIVE
Yeast Exam: NEGATIVE

## 2021-04-21 LAB — HM HIV SCREENING LAB: HM HIV Screening: NEGATIVE

## 2021-04-21 NOTE — Progress Notes (Signed)
[facility] Department ? ?STI clinic/screening visit ?[place] [address] ?[phone] ? ?Subjective:  ?Sheri Lee is a 26 y.o. female being seen today for an STI screening visit. The patient reports they do not have symptoms.  Patient reports that they do not desire a pregnancy in the next year.   They reported they are not interested in discussing contraception today.   ? ?Patient's last menstrual period was 04/08/2021 (exact date). ? ? ?Patient has the following medical conditions:   ?Patient Active Problem List  ? Diagnosis Date Noted  ? Thrombosed external hemorrhoid   ? Labor and delivery indication for care or intervention 11/17/2017  ? Low back pain 11/16/2017  ? Acute blood loss anemia 03/22/2016  ? S/P cesarean section 03/22/2016  ? Labor and delivery, indication for care 03/18/2016  ? ? ?Chief Complaint  ?Patient presents with  ? SEXUALLY TRANSMITTED DISEASE  ?  screening  ? ? ?HPI ? ?Patient reports her for screening, denies s/sx  ? ?Last HIV test per patient/review of record was 06/27/2020 ?Patient reports a couple of years ago .  ? ?Screening for MPX risk: ?Does the patient have an unexplained rash? No ?Is the patient MSM? No ?Does the patient endorse multiple sex partners or anonymous sex partners? No ?Did the patient have close or sexual contact with a person diagnosed with MPX? No ?Has the patient traveled outside the [place] where MPX is endemic? No ?Is there a high clinical suspicion for MPX-- evidenced by one of the following No ? -Unlikely to be chickenpox ? -Lymphadenopathy ? -Rash that present in same phase of evolution on any given body part ?See flowsheet for further details and programmatic requirements.  ? ? ?The following portions of the patient's history were reviewed and updated as appropriate: allergies, current medications, past medical history, past social history, past surgical history and problem list. ? ?Objective:  ?There were no vitals  filed for this visit. ? ?Physical Exam ?Vitals and nursing note reviewed.  ?Constitutional:   ?   Appearance: Normal appearance.  ?HENT:  ?   Head: Normocephalic and atraumatic.  ?   Mouth/Throat:  ?   Mouth: Mucous membranes are moist.  ?   Pharynx: Oropharynx is clear. No oropharyngeal exudate or posterior oropharyngeal erythema.  ?Pulmonary:  ?   Effort: Pulmonary effort is normal.  ?Abdominal:  ?   General: Abdomen is flat.  ?   Palpations: There is no mass.  ?   Tenderness: There is no abdominal tenderness. There is no rebound.  ?Genitourinary: ?   Exam position: Lithotomy position.  ?   Pubic Area: No rash or pubic lice.   ?   Labia:     ?   Right: No rash or lesion.     ?   Left: No rash or lesion.   ?   Vagina: No erythema, bleeding or lesions.  ?   Cervix: No cervical motion tenderness, discharge, friability, lesion or erythema.  ?   Uterus: Normal.   ?   Adnexa: Right adnexa normal and left adnexa normal.  ?   Comments: External genitalia without, lice, nits, erythema, edema , lesions or inguinal adenopathy. Vagina with normal mucosa and discharge and pH equals 4.  Cervix without visual lesions, uterus firm, mobile, non-tender, no masses, CMT adnexal fullness or tenderness.  ? ?Lymphadenopathy:  ?   Head:  ?   Right side of head: No preauricular or posterior auricular adenopathy.  ?  Left side of head: No preauricular or posterior auricular adenopathy.  ?   Cervical: No cervical adenopathy.  ?   Upper Body:  ?   Right upper body: No supraclavicular or axillary adenopathy.  ?   Left upper body: No supraclavicular or axillary adenopathy.  ?   Lower Body: No right inguinal adenopathy. No left inguinal adenopathy.  ?Skin: ?   General: Skin is warm and dry.  ?   Findings: No rash.  ?Neurological:  ?   Mental Status: She is alert and oriented to person, place, and time.  ? ? ? ?Assessment and Plan:  ?Sheri Lee is a 26 y.o. female presenting to the [facility] Department for STI  screening ? ?1. Screening for venereal disease ?Patient accepted all screenings including wet prep,  vaginal CT/GC and bloodwork for HIV/RPR.  ?Patient meets criteria for HepB screening? No. Ordered? No -   ?Patient meets criteria for HepC screening? No. Ordered? No -   ? ?Wet prep results neg    ?No Treatment needed  ?Discussed time line for State Lab results and that patient will be called with positive results and encouraged patient to call if she had not heard in 2 weeks.  ?Counseled to return or seek care for continued or worsening symptoms ?Recommended condom use with all sex ? ?Patient is currently using Postpartum Sterilization to prevent pregnancy.  ?- Chlamydia/Gonorrhea [place] [facility] ?- WET PREP FOR [place], YEAST, CLUE ?- Syphilis Serology, [place] [facility] ?- HIV [facility] ? ?2. Anxiety ?Accepting therapy  ?- Ambulatory referral to [facility] ? ? ? ? ?No follow-ups on file. ? ?No future appointments. ? ?Junious Dresser, FNP ?

## 2021-04-21 NOTE — Patient Instructions (Signed)
Steps to prevent BV and yeast: ?Wear all-cotton underwear ?Sleep without underwear ?Take showers instead of baths ?Wear loose fitting clothing, especially during warm/hot weather ?Use a hair dryer on low after bathing to dry the area ?Avoid scented soaps and body washes ?Do not douche ?May try over the counter probiotics or boric acid gel or suppositories ?Stop smoking Natural Remedies for Bacterial Vaginosis ? ?Option #1 ?1 Tbsp Fractitionated Coconut Oil ?10 drops of Melaleuca (Tea Tree) Oil ? ?Mix ingredients together well.  Soak 3-4 tampons (in applicators) in that mixture until all or mostly all mixture is soaked up into the tampons.  Insert 1 saturated tampon vaginally and wear overnight for 3-4 nights.   ? ?Option #2 (sometimes to be used in conjunction with option #1) ?Fill tub with enough to cover lap/lower abdomen warm water.  Mix 1/2 cup of baking soda in water.  Soak in water/baking soda mixture for at least 20 minutes.  Be sure to swish water in between legs to get as much in vagina as possible.  This soak should be done after sexual intercourse and menstrual cycles.   ? ? ?Option #3 (sometimes to be used in conjunction with option #1 and 2) ?Fill tub with enough to cover lap/lower abdomen warm water.  Mix 2-4 cup of apple cider vinegar in water.  Soak in water/vinegar mixture for at least 20 minutes.  Be sure to swish water in between legs to get as much in vagina as possible.  This soak should be done after sexual intercourse and menstrual cycles.   ? ?GO WHITE: ?Soap: UNSCENTED Dove (white box light green writing) ?Laundry detergent (underwear)- Dreft or [employer] unscented ?WHITE 100% cotton panties (NOT just cotton crouch) ?Sanitary napkin/panty liners: UNSCENTED.  If it doesn't SAY unscented it can have a scent/perfume    ?NO PERFUMES OR LOTIONS OR POTIONS in the vulvar area (may use regular [place]) ?Condoms: hypoallergenic only. Non dyed (no color) ?Toilet papers: white only ?Wash clothes: use a  separate wash cloth. WHITE.  Wash in [place].  ? ?You can purchase Tea Tree Oil locally at: ? ?[employer]  ?[employer]  ?[place], [place]  ? ?[employer] ?[address] ?[place] [address] ? ?[employer] ?[address] ?[place] [address] ? ?Advise that these alternatives will not replace the need to be evaluated if symptoms persist. You will need to seek care at an OB/GYN provider. ?

## 2021-05-18 ENCOUNTER — Ambulatory Visit: Payer: BC Managed Care – PPO | Admitting: Licensed Clinical Social Worker

## 2021-05-18 DIAGNOSIS — F4323 Adjustment disorder with mixed anxiety and depressed mood: Secondary | ICD-10-CM
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
## 2021-06-01 ENCOUNTER — Ambulatory Visit: Payer: Medicaid Other | Admitting: Licensed Clinical Social Worker

## 2021-06-01 NOTE — Progress Notes (Unsigned)
Counselor/Therapist Progress Note  Patient ID: Sheri Lee, MRN: [id number],    Date: 06/01/2021  Time Spent: ***   Treatment Type: Psychotherapy  Reported Symptoms: {CHL AMB Reported [phone]}  Mental Status Exam:  Appearance:   {PSY:22683}     Behavior:  {PSY:21022743}  Motor:  {PSY:22302}  Speech/Language:   {PSY:22685}  Affect:  {PSY:22687}  Mood:  {PSY:31886}  Thought process:  {PSY:31888}  Thought content:    {PSY:401-099-4759}  Sensory/Perceptual disturbances:    {PSY:734-343-1958}  Orientation:  {PSY:30297}  Attention:  {PSY:22877}  Concentration:  {PSY:628-466-2351}  Memory:  {PSY:940-505-7342}  Fund of knowledge:   {PSY:628-466-2351}  Insight:    {PSY:628-466-2351}  Judgment:   {PSY:628-466-2351}  Impulse Control:  {PSY:628-466-2351}   Risk Assessment: Danger to Self:  {PSY:22692} Self-injurious Behavior: {PSY:22692} Danger to Others: {PSY:22692} Duty to Warn:{PSY:311194} Physical Aggression / Violence:{PSY:21197} Access to Firearms a concern: {PSY:21197} Gang Involvement:{PSY:21197}  Subjective: Patient was engaged and cooperative throughout the session using time effectively to discuss treatment goals and plan. Patient was receptive to feedback and intervention from LCSW. Patient voices motivation for continued treatment and understanding of CBTs.    Interventions: {PSY:949-375-4712}  Checked in with patient and reviewed previous session, including assessment and goal of treatment. Reviewed CBTs. Explored patient's goal of treatment and worked collaboratively to develop CBT treatment plan. Provided support through active listening, validation of feelings, and highlighted patient's strengths.    Diagnosis:No diagnosis found.  Plan: ***  Future Appointments  Date Time Provider Department Center  06/01/2021  3:00 PM Kathreen Cosier, LCSW [facility] None    Kathreen Cosier, [place]
